# Patient Record
Sex: Female | Born: 1980
Health system: Southern US, Community
[De-identification: ages and names within clinical notes are randomized; demographics above are authoritative.]

## PROBLEM LIST (undated history)

## (undated) DIAGNOSIS — J45909 Unspecified asthma, uncomplicated: Secondary | ICD-10-CM

## (undated) DIAGNOSIS — K8000 Calculus of gallbladder with acute cholecystitis without obstruction: Secondary | ICD-10-CM

## (undated) DIAGNOSIS — F32A Depression, unspecified: Secondary | ICD-10-CM

## (undated) DIAGNOSIS — N200 Calculus of kidney: Secondary | ICD-10-CM

## (undated) DIAGNOSIS — R030 Elevated blood-pressure reading, without diagnosis of hypertension: Secondary | ICD-10-CM

## (undated) DIAGNOSIS — F329 Major depressive disorder, single episode, unspecified: Secondary | ICD-10-CM

## (undated) DIAGNOSIS — E669 Obesity, unspecified: Secondary | ICD-10-CM

## (undated) HISTORY — DX: Elevated blood-pressure reading, without diagnosis of hypertension: R03.0

## (undated) HISTORY — DX: Obesity, unspecified: E66.9

---
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
## 2002-07-29 DIAGNOSIS — N200 Calculus of kidney: Secondary | ICD-10-CM

## 2002-07-29 HISTORY — DX: Calculus of kidney: N20.0

## 2011-08-01 ENCOUNTER — Ambulatory Visit (INDEPENDENT_AMBULATORY_CARE_PROVIDER_SITE_OTHER): Payer: BC Managed Care – PPO | Admitting: Family Medicine

## 2011-08-01 ENCOUNTER — Encounter: Payer: Self-pay | Admitting: Family Medicine

## 2011-08-01 ENCOUNTER — Encounter: Payer: Self-pay | Admitting: *Deleted

## 2011-08-01 DIAGNOSIS — R030 Elevated blood-pressure reading, without diagnosis of hypertension: Secondary | ICD-10-CM

## 2011-08-01 DIAGNOSIS — E669 Obesity, unspecified: Secondary | ICD-10-CM

## 2011-08-01 DIAGNOSIS — IMO0001 Reserved for inherently not codable concepts without codable children: Secondary | ICD-10-CM | POA: Insufficient documentation

## 2011-08-01 DIAGNOSIS — Z0000 Encounter for general adult medical examination without abnormal findings: Secondary | ICD-10-CM

## 2011-08-01 LAB — BASIC METABOLIC PANEL
CO2: 25 mEq/L (ref 19–32)
Chloride: 108 mEq/L (ref 96–112)
Creatinine, Ser: 0.6 mg/dL (ref 0.4–1.2)
Potassium: 4 mEq/L (ref 3.5–5.1)

## 2011-08-01 LAB — HEPATIC FUNCTION PANEL
ALT: 16 U/L (ref 0–35)
AST: 19 U/L (ref 0–37)
Alkaline Phosphatase: 57 U/L (ref 39–117)
Bilirubin, Direct: 0 mg/dL (ref 0.0–0.3)
Total Protein: 7.8 g/dL (ref 6.0–8.3)

## 2011-08-01 LAB — CBC WITH DIFFERENTIAL/PLATELET
Basophils Absolute: 0.1 10*3/uL (ref 0.0–0.1)
Hemoglobin: 13 g/dL (ref 12.0–15.0)
Lymphocytes Relative: 32.5 % (ref 12.0–46.0)
Monocytes Relative: 5.6 % (ref 3.0–12.0)
Neutro Abs: 4.8 10*3/uL (ref 1.4–7.7)
RBC: 4.16 Mil/uL (ref 3.87–5.11)
RDW: 12.9 % (ref 11.5–14.6)

## 2011-08-01 LAB — LIPID PANEL
Cholesterol: 196 mg/dL (ref 0–200)
LDL Cholesterol: 111 mg/dL — ABNORMAL HIGH (ref 0–99)
Total CHOL/HDL Ratio: 3

## 2011-08-01 MED ORDER — DROSPIRENONE-ETHINYL ESTRADIOL 3-0.02 MG PO TABS: 1.0000 | ORAL_TABLET | Freq: Every day | ORAL | Status: DC

## 2011-08-01 MED ORDER — SERTRALINE HCL 100 MG PO TABS: 100.0000 mg | ORAL_TABLET | Freq: Every day | ORAL | Status: DC

## 2011-08-01 NOTE — Patient Instructions (Signed)
Follow up in 1 month to recheck weight loss progress and blood pressure We'll call you with your nutrition appt Goal is exercising for 30 minutes, 4-5x/week Check out the resources at the [facility] Call with any questions or concerns Welcome!  We're glad to have you!

## 2011-08-01 NOTE — Progress Notes (Signed)
  Subjective:    Patient ID: Joan Atkinson, female    DOB: Dec 13, 1980, 31 y.o.   MRN: [id number]  HPI New to establish.  Previously lived in [place].  UTD on pap (due Oct 2013)  Elevated BP- + family hx, 'everybody in my family has high blood pressure'.  No CP, SOB, HAs, visual changes, edema.  Obesity- has done weight watchers x2.  Last year weighed 187lbs.  Will effectively lose weight w/ weight watchers but will gain it all back when stops.  Admits to emotional eating, family cx ([place] is centered around food.   Review of Systems For ROS see HPI     Objective:   Physical Exam  Vitals reviewed. Constitutional: She is oriented to person, place, and time. She appears well-developed and well-nourished. No distress.  HENT:  Head: Normocephalic and atraumatic.  Eyes: Conjunctivae and EOM are normal. Pupils are equal, round, and reactive to light.  Neck: Normal range of motion. Neck supple. No thyromegaly present.  Cardiovascular: Normal rate, regular rhythm, normal heart sounds and intact distal pulses.   No murmur heard. Pulmonary/Chest: Effort normal and breath sounds normal. No respiratory distress.  Abdominal: Soft. She exhibits no distension. There is no tenderness.  Musculoskeletal: She exhibits no edema.  Lymphadenopathy:    She has no cervical adenopathy.  Neurological: She is alert and oriented to person, place, and time.  Skin: Skin is warm and dry.  Psychiatric: She has a normal mood and affect. Her behavior is normal.          Assessment & Plan:

## 2011-08-03 LAB — VITAMIN D 1,25 DIHYDROXY
Vitamin D 1, 25 (OH)2 Total: 141 pg/mL — ABNORMAL HIGH (ref 18–72)
Vitamin D2 1, 25 (OH)2: <8 pg/mL
Vitamin D3 1, 25 (OH)2: 141 pg/mL

## 2011-08-06 ENCOUNTER — Encounter: Payer: Self-pay | Admitting: *Deleted

## 2011-08-11 NOTE — Assessment & Plan Note (Signed)
Pt w/ strong family hx of HTN.  Reviewed lifestyle modifications but will follow closely and start meds prn.  Pt expressed understanding and is in agreement w/ plan.

## 2011-08-11 NOTE — Assessment & Plan Note (Signed)
Ongoing struggle for pt.  Plans on resuming weight watchers.  Admits emotional component of eating.  Open to idea of seeing nutritionist/therapist.  Applauded her desire for change.  Will follow closely.

## 2011-08-27 ENCOUNTER — Encounter: Payer: Self-pay | Admitting: *Deleted

## 2011-08-27 ENCOUNTER — Encounter: Payer: BC Managed Care – PPO | Attending: Family Medicine | Admitting: *Deleted

## 2011-08-27 DIAGNOSIS — E669 Obesity, unspecified: Secondary | ICD-10-CM | POA: Insufficient documentation

## 2011-08-27 DIAGNOSIS — R030 Elevated blood-pressure reading, without diagnosis of hypertension: Secondary | ICD-10-CM | POA: Insufficient documentation

## 2011-08-27 DIAGNOSIS — Z713 Dietary counseling and surveillance: Secondary | ICD-10-CM | POA: Insufficient documentation

## 2011-08-27 NOTE — Patient Instructions (Addendum)
Goals:  Follow the D.A.S.H. Diet (see handout). --> Choose more whole grains, lean protein, low-fat dairy, and fruits/non-starchy vegetables. --> Aim for <2000 mg daily - check food labels or www.calorieking.com for sodium amounts.  Eat 3 meals/day; Stay within carb ranges on yellow card.   Avoid adding sodium to foods; try your own seasoning blend (see handout).  Add protein rich foods to all meals and snacks.   Aim for 30-45 min of physical activity 3-4 days/week; gradually increase to most days.  Increase fiber to 25-30 g daily.

## 2011-08-27 NOTE — Progress Notes (Signed)
Medical Nutrition Therapy:  Appt start time: 0900   End time:  1000.  Initial Assessment:  Elevated Blood Pressure, Obesity.  Pt here for assessment of elevated BP and obesity. Has lost ~3 lbs since MD visit (08/01/11) and eager to get started. Reports losing wt on Weight Watchers program, but gained back when resuming old eating habits. Good dietary intake overall, though excessive CHO and added sodium noted.  Walks small dog for 20 min daily; no other structured exercise noted.   TANITA BODY COMPOSITION RESULTS: %Fat: 44.2 5 FM: 94.0 lbs FFM: 119.0 lbs TBW: 87.0 lbs  MEDICATIONS: See medication list; reconciled with pt.   DIETARY INTAKE:  Usual eating pattern includes 3 meals and 2 snacks per day.  24-hr recall:  B ( AM): Fruit smoothie w/ ground oatmeal (1/4 c), yogurt, 2 % milk, frozen fruit (magic bullet) = 8-12 oz  Snk ( AM): peanut butter (6 pk nabs)  L ( PM): Chicken soup and chicken on bun, jalapeno pretzel pcs, sauteed bell peppers w/ garlic; diet coke Snk ( PM): jalapeno pretzels D ( PM): Frozen salmon filet (sauteed) w/ garlic salt, sweet potato fries (3-4 oz) w/ olive oil & salt, steamed broccoli; water  Snk ( PM): Popcorn (homemade in canola oil on stove)  Usual physical activity:  Walks dog for 20 min/day - mild pace; sometimes more moderate.  Estimated energy needs: 1300-1500 calories 160-185 g carbohydrates 80-95 g protein 35-40 g fat  Progress Towards Goal(s):  In progress.   Nutritional Diagnosis:  Ashley-3.3 Obesity related to excessive CHO intake as evidenced by 24 hour food recall and a BMI of 37.4 kg/m^2.    Intervention/Goals:  Follow the D.A.S.H. Diet (see handout). --> Choose more whole grains, lean protein, low-fat dairy, and fruits/non-starchy vegetables. --> Aim for <2000 mg daily - check food labels or www.calorieking.com for sodium amounts.  Eat 3 meals/day; Stay within carb ranges on yellow card.   Avoid adding sodium to foods; try your own  seasoning blend (see handout).  Add protein rich foods to all meals and snacks.   Aim for 30-45 min of physical activity 3-4 days/week; gradually increase to most days.  Increase fiber to 25-30 g daily.  Handouts given during visit include:  High Blood Pressure/D.A.S.H. diet  Supermarket Guide  Snack List/45g CHO menu ideas  Monitoring/Evaluation:  Dietary intake, exercise, and body weight in 6 week(s).

## 2011-09-02 ENCOUNTER — Ambulatory Visit (INDEPENDENT_AMBULATORY_CARE_PROVIDER_SITE_OTHER): Payer: BC Managed Care – PPO | Admitting: Family Medicine

## 2011-09-02 ENCOUNTER — Encounter: Payer: Self-pay | Admitting: Family Medicine

## 2011-09-02 DIAGNOSIS — R030 Elevated blood-pressure reading, without diagnosis of hypertension: Secondary | ICD-10-CM

## 2011-09-02 DIAGNOSIS — E559 Vitamin D deficiency, unspecified: Secondary | ICD-10-CM | POA: Insufficient documentation

## 2011-09-02 DIAGNOSIS — E673 Hypervitaminosis D: Secondary | ICD-10-CM

## 2011-09-02 NOTE — Patient Instructions (Signed)
Follow up in 6 months- sooner if needed- to recheck weight and blood pressure Keep up the good work on healthy diet and regular exercise- you can do this! We'll notify you of your lab results Call with any questions or concerns Hang in there!!!

## 2011-09-02 NOTE — Assessment & Plan Note (Signed)
Pt's BP is closer to normal range today after changing her diet and losing 2 lbs.  Asymptomatic.  Will not start meds at this time but will continue to follow at future visits.  Pt expressed understanding and is in agreement w/ plan.

## 2011-09-02 NOTE — Assessment & Plan Note (Signed)
Identified at previous visit.  Repeat labs today and see if value improves.

## 2011-09-02 NOTE — Progress Notes (Signed)
  Subjective:    Patient ID: Joan Atkinson, female    DOB: 1980-08-15, 31 y.o.   MRN: [id number]  HPI Elevated BP- has lost 2 lbs, saw nutrition, 'she was super helpful'.  BP is better today.  No CP, SOB, HAs, visual changes, edema.  Elevated Vit D- admits eating salmon 2-3x/week, not taking supplement.   Review of Systems For ROS see HPI     Objective:   Physical Exam  Vitals reviewed. Constitutional: She is oriented to person, place, and time. She appears well-developed and well-nourished. No distress.  HENT:  Head: Normocephalic and atraumatic.  Eyes: Conjunctivae and EOM are normal. Pupils are equal, round, and reactive to light.  Neck: Normal range of motion. Neck supple. No thyromegaly present.  Cardiovascular: Normal rate, regular rhythm, normal heart sounds and intact distal pulses.   No murmur heard. Pulmonary/Chest: Effort normal and breath sounds normal. No respiratory distress.  Abdominal: Soft. She exhibits no distension. There is no tenderness.  Musculoskeletal: She exhibits no edema.  Lymphadenopathy:    She has no cervical adenopathy.  Neurological: She is alert and oriented to person, place, and time.  Skin: Skin is warm and dry.  Psychiatric: She has a normal mood and affect. Her behavior is normal.          Assessment & Plan:

## 2011-09-06 LAB — VITAMIN D 1,25 DIHYDROXY
Vitamin D 1, 25 (OH)2 Total: 98 pg/mL — ABNORMAL HIGH (ref 18–72)
Vitamin D2 1, 25 (OH)2: <8 pg/mL
Vitamin D3 1, 25 (OH)2: 98 pg/mL

## 2012-03-02 ENCOUNTER — Ambulatory Visit: Payer: BC Managed Care – PPO | Admitting: Family Medicine

## 2012-03-09 ENCOUNTER — Ambulatory Visit (INDEPENDENT_AMBULATORY_CARE_PROVIDER_SITE_OTHER): Payer: BC Managed Care – PPO | Admitting: Family Medicine

## 2012-03-09 ENCOUNTER — Encounter: Payer: Self-pay | Admitting: Family Medicine

## 2012-03-09 VITALS — BP 128/82 | HR 90 | Temp 98.5°F | Ht 64.0 in | Wt 215.2 lb

## 2012-03-09 DIAGNOSIS — R030 Elevated blood-pressure reading, without diagnosis of hypertension: Secondary | ICD-10-CM

## 2012-03-09 DIAGNOSIS — IMO0001 Reserved for inherently not codable concepts without codable children: Secondary | ICD-10-CM

## 2012-03-09 DIAGNOSIS — J309 Allergic rhinitis, unspecified: Secondary | ICD-10-CM

## 2012-03-09 MED ORDER — CETIRIZINE HCL 10 MG PO TABS: 10.0000 mg | ORAL_TABLET | Freq: Every day | ORAL | Status: DC

## 2012-03-09 NOTE — Assessment & Plan Note (Signed)
Improved.  Encouraged pt to continue healthy food choices and get regular exercise.  Will continue to follow.

## 2012-03-09 NOTE — Patient Instructions (Addendum)
Schedule your complete physical in 6 months Keep up the good work!  You look great! Start the Zyrtec daily Call with any questions or concerns Enjoy what's left of summer!!!

## 2012-03-09 NOTE — Assessment & Plan Note (Signed)
New.  Pt w/out evidence of infxn on PE.  Start daily OTC antihistamine.  Reviewed supportive care and red flags that should prompt return.  Pt expressed understanding and is in agreement w/ plan.

## 2012-03-09 NOTE — Progress Notes (Signed)
  Subjective:    Patient ID: Joan Atkinson, female    DOB: Dec 28, 1980, 31 y.o.   MRN: [id number]  HPI Elevated BP- much better today.  No CP, SOB, HAs, visual changes, edema.  Exercising 3-4x/week.  Has eliminated artificial sweeteners from diet.  Nasal congestion- recently flew home from [place], no fevers.  Mucous is clear.  No ear pain, facial pain.  mucinex helped w/ some tooth pain on Friday.  Mild cough- particularly in AM.  + PND.  Was asymptomatic in [place].  sxs started Thursday night.   Review of Systems For ROS see HPI     Objective:   Physical Exam  Vitals reviewed. Constitutional: She appears well-developed and well-nourished. No distress.  HENT:  Head: Normocephalic and atraumatic.  Right Ear: Tympanic membrane normal.  Left Ear: Tympanic membrane normal.  Nose: Mucosal edema and rhinorrhea present. Right sinus exhibits no maxillary sinus tenderness and no frontal sinus tenderness. Left sinus exhibits no maxillary sinus tenderness and no frontal sinus tenderness.  Mouth/Throat: Mucous membranes are normal. Posterior oropharyngeal erythema (w/ PND) present.  Eyes: Conjunctivae and EOM are normal. Pupils are equal, round, and reactive to light.  Neck: Normal range of motion. Neck supple. No thyromegaly present.  Cardiovascular: Normal rate, regular rhythm and normal heart sounds.   Pulmonary/Chest: Effort normal and breath sounds normal. No respiratory distress. She has no wheezes. She has no rales.  Musculoskeletal: She exhibits no edema.  Lymphadenopathy:    She has no cervical adenopathy.  Skin: Skin is warm and dry.  Psychiatric: She has a normal mood and affect. Her behavior is normal.          Assessment & Plan:

## 2012-05-30 ENCOUNTER — Encounter (HOSPITAL_BASED_OUTPATIENT_CLINIC_OR_DEPARTMENT_OTHER): Payer: Self-pay | Admitting: *Deleted

## 2012-05-30 ENCOUNTER — Emergency Department (HOSPITAL_BASED_OUTPATIENT_CLINIC_OR_DEPARTMENT_OTHER)
Admission: EM | Admit: 2012-05-30 | Discharge: 2012-05-30 | Disposition: A | Discharge status: Home / Self Care | Payer: BC Managed Care – PPO | Attending: Emergency Medicine | Admitting: Emergency Medicine

## 2012-05-30 DIAGNOSIS — W260XXA Contact with knife, initial encounter: Secondary | ICD-10-CM | POA: Insufficient documentation

## 2012-05-30 DIAGNOSIS — S61409A Unspecified open wound of unspecified hand, initial encounter: Secondary | ICD-10-CM | POA: Insufficient documentation

## 2012-05-30 DIAGNOSIS — Z79899 Other long term (current) drug therapy: Secondary | ICD-10-CM | POA: Insufficient documentation

## 2012-05-30 DIAGNOSIS — Y929 Unspecified place or not applicable: Secondary | ICD-10-CM | POA: Insufficient documentation

## 2012-05-30 DIAGNOSIS — S61419A Laceration without foreign body of unspecified hand, initial encounter: Secondary | ICD-10-CM

## 2012-05-30 DIAGNOSIS — R030 Elevated blood-pressure reading, without diagnosis of hypertension: Secondary | ICD-10-CM | POA: Insufficient documentation

## 2012-05-30 DIAGNOSIS — Z23 Encounter for immunization: Secondary | ICD-10-CM | POA: Insufficient documentation

## 2012-05-30 DIAGNOSIS — Z87891 Personal history of nicotine dependence: Secondary | ICD-10-CM | POA: Insufficient documentation

## 2012-05-30 DIAGNOSIS — J45909 Unspecified asthma, uncomplicated: Secondary | ICD-10-CM | POA: Insufficient documentation

## 2012-05-30 DIAGNOSIS — Y939 Activity, unspecified: Secondary | ICD-10-CM | POA: Insufficient documentation

## 2012-05-30 MED ORDER — LIDOCAINE HCL 2 % IJ SOLN
INTRAMUSCULAR | Status: AC
  Administered 2012-05-30: 19:00:00
  Filled 2012-05-30: qty 20

## 2012-05-30 MED ORDER — TETANUS-DIPHTH-ACELL PERTUSSIS 5-2.5-18.5 LF-MCG/0.5 IM SUSP
0.5000 mL | Freq: Once | INTRAMUSCULAR | Status: AC
  Administered 2012-05-30: 0.5 mL via INTRAMUSCULAR
  Filled 2012-05-30: qty 0.5

## 2012-05-30 NOTE — ED Notes (Signed)
Pt presents to ED today with lac to left hand from a kitchen knife.  Bleeding is controlled

## 2012-05-30 NOTE — ED Provider Notes (Signed)
History  This chart was scribed for Jones Skene, MD by Marlin Canary. The patient was seen in room MH02/MH02. Patient's care was started at 1817.  CSN: [id number]  Arrival date & time 05/30/12  1754   First MD Initiated Contact with Patient 05/30/12 1817      Chief Complaint  Patient presents with  . Extremity Laceration     The history is provided by the patient. No language interpreter was used.    Joan Atkinson is a 31 y.o. female who presents to the Emergency Department complaining mildly painful laceration to palmar surface of left hand onset 1.5 hours ago when she accidentally stabbed herself with a kitchen knife while cutting a sweet potato. She rates pain as 2 out of 10. Patient states that there was a moderate amount of bleeding. She says that she cleansed the laceration with water and then wrapped the area with a towel. Patient states that ROM and sensation in her hand is normal. She denies any other symptoms at this time. Patient is a former smoker. She consumes alcohol socially. Patient's Tdap is out of date. Patient's medical history includes asthma.    Past Medical History  Diagnosis Date  . Asthma   . Elevated blood pressure reading without diagnosis of hypertension   . Obesity, unspecified     Past Surgical History  Procedure Date  . Epideral hemotoma 1983    Family History  Problem Relation Age of Onset  . Hypertension Mother   . Hyperlipidemia Mother   . Hyperlipidemia Father   . Hypertension Sister   . Hyperlipidemia Sister   . Mental illness Sister   . Hypertension Maternal Aunt   . Hyperlipidemia Maternal Aunt   . Hypertension Paternal Aunt   . Hyperlipidemia Paternal Aunt   . Hypertension Maternal Grandmother   . Hyperlipidemia Maternal Grandfather   . Hypertension Maternal Grandfather   . Diabetes Maternal Grandfather   . Heart disease Maternal Grandfather   . Cancer Maternal Grandfather   . Hypertension Paternal Grandmother   .  Hyperlipidemia Paternal Grandmother   . Stroke Paternal Grandfather     History  Substance Use Topics  . Smoking status: Former Smoker    Types: Cigarettes  . Smokeless tobacco: Never Used   Comment: Quit smoking cigarettes 3-4 yrs ago - 0.25 ppd  . Alcohol Use: Yes     socially    OB History    Grav Para Term Preterm Abortions TAB SAB Ect Mult Living                  Review of Systems At least 10pt or greater review of systems completed and are negative except where specified in the HPI.  Allergies  Review of patient's allergies indicates no known allergies.  Home Medications   Current Outpatient Rx  Name Route Sig Dispense Refill  . CETIRIZINE HCL 10 MG PO TABS Oral Take 1 tablet (10 mg total) by mouth daily. 30 tablet 11  . DROSPIRENONE-ETHINYL ESTRADIOL 3-0.02 MG PO TABS Oral Take 1 tablet by mouth daily. 1 Package 11  . SERTRALINE HCL 100 MG PO TABS Oral Take 1 tablet (100 mg total) by mouth daily. 30 tablet 11    BP 155/97  Pulse 90  Temp 98.5 F (36.9 C)  Resp 20  SpO2 99%  Physical Exam  Nursing notes reviewed.  Electronic medical record reviewed. VITAL SIGNS:   Filed Vitals:   05/30/12 1810  BP: 155/97  Pulse: 90  Temp: 98.5  F (36.9 C)  Resp: 20  SpO2: 99%   CONSTITUTIONAL: Awake, oriented, appears non-toxic HENT: Atraumatic, normocephalic, oral mucosa pink and moist, airway patent. Nares patent without drainage. External ears normal. EYES: Conjunctiva clear, EOMI, PERRLA NECK: Trachea midline, non-tender, supple CARDIOVASCULAR: Normal heart rate, Normal rhythm, No murmurs, rubs, gallops PULMONARY/CHEST: Clear to auscultation, no rhonchi, wheezes, or rales. Symmetrical breath sounds. Non-tender. ABDOMINAL: Non-distended, soft, non-tender - no rebound or guarding.  BS normal. NEUROLOGIC: Non-focal, moving all four extremities, no gross sensory or motor deficits. EXTREMITIES: No clubbing, cyanosis, or edema. 1.5 simple, straight laceration to  left palm with minimal exposed underlying fatty tissue. No tendon involvement.  Fascia not visible.  No FB.  Distally NVI. SKIN: Warm, Dry, No erythema, No rash  ED Course  LACERATION REPAIR Performed by: Jones Skene Authorized by: Jones Skene Consent: Verbal consent obtained. Patient identity confirmed: verbally with patient Time out: Immediately prior to procedure a "time out" was called to verify the correct patient, procedure, equipment, support staff and site/side marked as required. Body area: upper extremity Location details: left hand Laceration length: 1.5 cm Foreign bodies: no foreign bodies Tendon involvement: none Nerve involvement: none Vascular damage: no Anesthesia: local infiltration Local anesthetic: lidocaine 2% with epinephrine Anesthetic total: 2 ml Patient sedated: no Preparation: Patient was prepped and draped in the usual sterile fashion. Irrigation solution: saline Amount of cleaning: standard Debridement: none Degree of undermining: none Skin closure: 4-0 Prolene Number of sutures: 3 Technique: simple Approximation: close Approximation difficulty: simple Dressing: 4x4 sterile gauze and antibiotic ointment Patient tolerance: Patient tolerated the procedure well with no immediate complications.   (including critical care time) COORDINATION OF CARE: 6:35pm- Patient informed of current plan for treatment and evaluation and agrees with plan at this time. Placed 3 interrupted sutures at laceration site.     Labs Reviewed - No data to display No results found.   1. Laceration of skin of palm       MDM  Joan Atkinson is a 31 y.o. female presented with laceration to left palm.  Repaired wound, updated tetanus.   I explained the diagnosis and have given explicit precautions to return to the ER including signs of infection or any other new or worsening symptoms. The patient understands and accepts the medical plan as it's been dictated and I  have answered their questions. Discharge instructions concerning home care and prescriptions have been given.  The patient is STABLE and is discharged to home in good condition.  I personally performed the services described in this documentation, which was scribed in my presence. The recorded information has been reviewed and considered. Jones Skene, M.D.      Jones Skene, MD 05/31/12 [phone]

## 2012-06-08 ENCOUNTER — Ambulatory Visit (INDEPENDENT_AMBULATORY_CARE_PROVIDER_SITE_OTHER): Payer: BC Managed Care – PPO | Admitting: Family Medicine

## 2012-06-08 VITALS — BP 120/82 | HR 81 | Temp 99.0°F | Wt 222.0 lb

## 2012-06-08 DIAGNOSIS — S61409A Unspecified open wound of unspecified hand, initial encounter: Secondary | ICD-10-CM

## 2012-06-08 DIAGNOSIS — S61419A Laceration without foreign body of unspecified hand, initial encounter: Secondary | ICD-10-CM

## 2012-06-08 NOTE — Assessment & Plan Note (Signed)
New.  Well healed.  Sutures removed w/out difficulty.  Reviewed after care instructions.  Pt expressed understanding and is in agreement w/ plan.

## 2012-06-08 NOTE — Progress Notes (Signed)
  Subjective:    Patient ID: Joan Atkinson, female    DOB: July 20, 1981, 31 y.o.   MRN: [id number]  HPI Suture removal- pt cut hand on knife while attempting to cut sweet potato 9 days ago.  Required 3 stitches to L palm.  Pt reports no pain, drainage.  Here for removal   Review of Systems For ROS see HPI     Objective:   Physical Exam  Vitals reviewed. Constitutional: She appears well-developed and well-nourished. No distress.  Skin: Skin is warm and dry.       Well healed palmar incision on L w/ 3 suture.  Easily removed sutures, area cleaned w/ H2O2, triple abx ointment applied and band aid in place.          Assessment & Plan:

## 2012-08-24 ENCOUNTER — Other Ambulatory Visit: Payer: Self-pay | Admitting: Family Medicine

## 2012-08-24 NOTE — Telephone Encounter (Signed)
Spoke with the pt to verify [facility] med.  Pt stated that she did not request refill for [facility].   Informed the pt that the pharmacy sent refill request for her [facility] and it not the same [facility] we have on her med list.  Pt stated that she does not get her [facility] from anyone else.  Pt stated that she has an appt for CPE with Dr. Beverely Low next mos(08-31-12 @ 8:30am).  She will request RF's on her [facility] then.  Pt stated that she will not be running out before the appt.  Will disregard the RF request.//AB/CMA

## 2012-08-24 NOTE — Telephone Encounter (Signed)
REFILL LORYNA 2MG -0.02MG  TABLET #280 TAKE 1 TABLET BY MOUTH DAILY LAST FILL 12.31.13

## 2012-08-24 NOTE — Telephone Encounter (Signed)
LM @ (2:57pm) asking the pt to RTC.//AB/CMA

## 2012-08-27 ENCOUNTER — Telehealth: Payer: Self-pay | Admitting: Family Medicine

## 2012-08-27 NOTE — Telephone Encounter (Signed)
Please advise on RF request.  Last OV:08-01-11.//AB/CMA

## 2012-08-27 NOTE — Telephone Encounter (Signed)
Refill: Sertraline hcl 100mg  tablet. Take 1 tablet by mouth daily. Qty 30. Last fill 07-30-12

## 2012-08-27 NOTE — Telephone Encounter (Signed)
Ok for #30, 6 refills b/c pt has upcoming CPE scheduled

## 2012-08-28 MED ORDER — SERTRALINE HCL 100 MG PO TABS: 100.0000 mg | ORAL_TABLET | Freq: Every day | ORAL | Status: DC

## 2012-08-28 NOTE — Telephone Encounter (Signed)
RX sent

## 2012-08-31 ENCOUNTER — Other Ambulatory Visit (HOSPITAL_COMMUNITY)
Admission: RE | Admit: 2012-08-31 | Discharge: 2012-08-31 | Disposition: A | Discharge status: Home / Self Care | Payer: BC Managed Care – PPO | Source: Ambulatory Visit | Attending: Family Medicine | Admitting: Family Medicine

## 2012-08-31 ENCOUNTER — Ambulatory Visit (INDEPENDENT_AMBULATORY_CARE_PROVIDER_SITE_OTHER): Payer: BC Managed Care – PPO | Admitting: Family Medicine

## 2012-08-31 ENCOUNTER — Encounter: Payer: Self-pay | Admitting: Family Medicine

## 2012-08-31 VITALS — BP 130/78 | HR 76 | Temp 98.4°F | Ht 63.75 in | Wt 222.0 lb

## 2012-08-31 DIAGNOSIS — Z124 Encounter for screening for malignant neoplasm of cervix: Secondary | ICD-10-CM

## 2012-08-31 DIAGNOSIS — Z0000 Encounter for general adult medical examination without abnormal findings: Secondary | ICD-10-CM | POA: Insufficient documentation

## 2012-08-31 DIAGNOSIS — Z01419 Encounter for gynecological examination (general) (routine) without abnormal findings: Secondary | ICD-10-CM

## 2012-08-31 LAB — LDL CHOLESTEROL, DIRECT: Direct LDL: 135.6 mg/dL

## 2012-08-31 LAB — CBC WITH DIFFERENTIAL/PLATELET
Basophils Relative: 0.8 % (ref 0.0–3.0)
Eosinophils Relative: 1.9 % (ref 0.0–5.0)
Lymphocytes Relative: 34.4 % (ref 12.0–46.0)
MCV: 87.6 fl (ref 78.0–100.0)
Monocytes Absolute: 0.5 10*3/uL (ref 0.1–1.0)
Monocytes Relative: 5.6 % (ref 3.0–12.0)
Neutrophils Relative %: 57.3 % (ref 43.0–77.0)
Platelets: 318 10*3/uL (ref 150.0–400.0)
RBC: 4.28 Mil/uL (ref 3.87–5.11)
WBC: 8.7 10*3/uL (ref 4.5–10.5)

## 2012-08-31 LAB — BASIC METABOLIC PANEL
Chloride: 105 mEq/L (ref 96–112)
GFR: 135.42 mL/min (ref >60.00)
Glucose, Bld: 84 mg/dL (ref 70–99)
Potassium: 4 mEq/L (ref 3.5–5.1)
Sodium: 138 mEq/L (ref 135–145)

## 2012-08-31 LAB — HEPATIC FUNCTION PANEL
ALT: 23 U/L (ref 0–35)
AST: 24 U/L (ref 0–37)
Albumin: 3.8 g/dL (ref 3.5–5.2)
Alkaline Phosphatase: 56 U/L (ref 39–117)

## 2012-08-31 LAB — LIPID PANEL: Cholesterol: 211 mg/dL — ABNORMAL HIGH (ref 0–200)

## 2012-08-31 MED ORDER — DROSPIRENONE-ETHINYL ESTRADIOL 3-0.02 MG PO TABS: 1.0000 | ORAL_TABLET | Freq: Every day | ORAL | Status: DC

## 2012-08-31 MED ORDER — SERTRALINE HCL 100 MG PO TABS: 100.0000 mg | ORAL_TABLET | Freq: Every day | ORAL | Status: DC

## 2012-08-31 MED ORDER — CETIRIZINE HCL 10 MG PO TABS: 10.0000 mg | ORAL_TABLET | Freq: Every day | ORAL | Status: DC

## 2012-08-31 NOTE — Assessment & Plan Note (Signed)
Pt's PE WNL.  Check labs.  Anticipatory guidance provided.  

## 2012-08-31 NOTE — Addendum Note (Signed)
Addended by: Sheliah Hatch on: 08/31/2012 09:12 AM   Modules accepted: Orders

## 2012-08-31 NOTE — Patient Instructions (Addendum)
Follow up in 1 year or as needed Keep up the good work!  You look good! We'll notify you of your lab results and make any changes if needed Call with any questions or concerns Happy Monday!!!

## 2012-08-31 NOTE — Progress Notes (Signed)
  Subjective:    Patient ID: Joan Atkinson, female    DOB: 05/03/1981, 32 y.o.   MRN: [id number]  HPI CPE- no concerns today.   Review of Systems Patient reports no vision/ hearing changes, adenopathy,fever, weight change,  persistant/recurrent hoarseness , swallowing issues, chest pain, palpitations, edema, persistant/recurrent cough, hemoptysis, dyspnea (rest/exertional/paroxysmal nocturnal), gastrointestinal bleeding (melena, rectal bleeding), abdominal pain, significant heartburn, bowel changes, GU symptoms (dysuria, hematuria, incontinence), Gyn symptoms (abnormal  bleeding, pain),  syncope, focal weakness, memory loss, numbness & tingling, skin/hair/nail changes, abnormal bruising or bleeding, anxiety, or depression.     Objective:   Physical Exam  General Appearance:    Alert, cooperative, no distress, appears stated age  Head:    Normocephalic, without obvious abnormality, atraumatic  Eyes:    PERRL, conjunctiva/corneas clear, EOM's intact, fundi    benign, both eyes  Ears:    Normal TM's and external ear canals, both ears  Nose:   Nares normal, septum midline, mucosa normal, no drainage    or sinus tenderness  Throat:   Lips, mucosa, and tongue normal; teeth and gums normal  Neck:   Supple, symmetrical, trachea midline, no adenopathy;    Thyroid: no enlargement/tenderness/nodules  Back:     Symmetric, no curvature, ROM normal, no CVA tenderness  Lungs:     Clear to auscultation bilaterally, respirations unlabored  Chest Wall:    No tenderness or deformity   Heart:    Regular rate and rhythm, S1 and S2 normal, no murmur, rub   or gallop  Breast Exam:    No tenderness, masses, or nipple abnormality  Abdomen:     Soft, non-tender, bowel sounds active all four quadrants,    no masses, no organomegaly  Genitalia:    External genitalia normal, cervix normal in appearance, no CMT, uterus in normal size and position, adnexa w/out mass or tenderness, mucosa pink and moist, no lesions  or discharge present  Rectal:    Normal external appearance  Extremities:   Extremities normal, atraumatic, no cyanosis or edema  Pulses:   2+ and symmetric all extremities  Skin:   Skin color, texture, turgor normal, no rashes or lesions  Lymph nodes:   Cervical, supraclavicular, and axillary nodes normal  Neurologic:   CNII-XII intact, normal strength, sensation and reflexes    throughout          Assessment & Plan:

## 2012-08-31 NOTE — Assessment & Plan Note (Signed)
Pap collected. 

## 2012-09-01 ENCOUNTER — Encounter: Payer: Self-pay | Admitting: *Deleted

## 2012-09-03 LAB — VITAMIN D 1,25 DIHYDROXY
Vitamin D2 1, 25 (OH)2: <8 pg/mL
Vitamin D3 1, 25 (OH)2: 81 pg/mL

## 2012-09-04 ENCOUNTER — Encounter: Payer: Self-pay | Admitting: *Deleted

## 2013-03-11 ENCOUNTER — Other Ambulatory Visit: Payer: Self-pay | Admitting: Family Medicine

## 2013-03-11 NOTE — Telephone Encounter (Signed)
Rx sent to the pharmacy by e-script.//AB/CMA 

## 2013-03-15 ENCOUNTER — Telehealth: Payer: Self-pay

## 2013-03-15 ENCOUNTER — Encounter: Payer: Self-pay | Admitting: General Practice

## 2013-03-15 ENCOUNTER — Observation Stay (HOSPITAL_COMMUNITY)
Admission: EM | Admit: 2013-03-15 | Discharge: 2013-03-17 | Disposition: A | Discharge status: Home / Self Care | Payer: BC Managed Care – PPO | Attending: Surgery | Admitting: Surgery

## 2013-03-15 ENCOUNTER — Ambulatory Visit (HOSPITAL_BASED_OUTPATIENT_CLINIC_OR_DEPARTMENT_OTHER)
Admission: RE | Admit: 2013-03-15 | Discharge: 2013-03-15 | Disposition: A | Discharge status: Home / Self Care | Payer: BC Managed Care – PPO | Source: Ambulatory Visit | Attending: Family Medicine | Admitting: Family Medicine

## 2013-03-15 ENCOUNTER — Ambulatory Visit (INDEPENDENT_AMBULATORY_CARE_PROVIDER_SITE_OTHER): Payer: BC Managed Care – PPO | Admitting: Family Medicine

## 2013-03-15 ENCOUNTER — Encounter (HOSPITAL_COMMUNITY): Payer: Self-pay | Admitting: Nurse Practitioner

## 2013-03-15 ENCOUNTER — Encounter: Payer: Self-pay | Admitting: Family Medicine

## 2013-03-15 VITALS — BP 134/86 | HR 60 | Temp 98.5°F | Ht 63.75 in | Wt 227.4 lb

## 2013-03-15 DIAGNOSIS — E669 Obesity, unspecified: Secondary | ICD-10-CM | POA: Insufficient documentation

## 2013-03-15 DIAGNOSIS — Z6839 Body mass index (BMI) 39.0-39.9, adult: Secondary | ICD-10-CM | POA: Insufficient documentation

## 2013-03-15 DIAGNOSIS — K8000 Calculus of gallbladder with acute cholecystitis without obstruction: Secondary | ICD-10-CM

## 2013-03-15 DIAGNOSIS — J45909 Unspecified asthma, uncomplicated: Secondary | ICD-10-CM | POA: Insufficient documentation

## 2013-03-15 DIAGNOSIS — E860 Dehydration: Secondary | ICD-10-CM | POA: Insufficient documentation

## 2013-03-15 DIAGNOSIS — R1013 Epigastric pain: Secondary | ICD-10-CM

## 2013-03-15 DIAGNOSIS — R1011 Right upper quadrant pain: Secondary | ICD-10-CM | POA: Insufficient documentation

## 2013-03-15 DIAGNOSIS — R030 Elevated blood-pressure reading, without diagnosis of hypertension: Secondary | ICD-10-CM | POA: Insufficient documentation

## 2013-03-15 DIAGNOSIS — K810 Acute cholecystitis: Secondary | ICD-10-CM

## 2013-03-15 HISTORY — DX: Depression, unspecified: F32.A

## 2013-03-15 HISTORY — DX: Major depressive disorder, single episode, unspecified: F32.9

## 2013-03-15 HISTORY — DX: Calculus of gallbladder with acute cholecystitis without obstruction: K80.00

## 2013-03-15 HISTORY — DX: Unspecified asthma, uncomplicated: J45.909

## 2013-03-15 HISTORY — DX: Calculus of kidney: N20.0

## 2013-03-15 LAB — CBC WITH DIFFERENTIAL/PLATELET
Basophils Relative: 0.3 % (ref 0.0–3.0)
Eosinophils Absolute: 0 10*3/uL (ref 0.0–0.7)
Eosinophils Absolute: 0 10*3/uL (ref 0.0–0.7)
Eosinophils Relative: 0.1 % (ref 0.0–5.0)
Eosinophils Relative: 0 % (ref 0–5)
HCT: 38.8 % (ref 36.0–46.0)
Hemoglobin: 13.6 g/dL (ref 12.0–15.0)
Lymphocytes Relative: 12.4 % (ref 12.0–46.0)
Lymphocytes Relative: 16 % (ref 12–46)
Lymphs Abs: 2.9 10*3/uL (ref 0.7–4.0)
MCH: 31 pg (ref 26.0–34.0)
MCV: 88.4 fL (ref 78.0–100.0)
Monocytes Absolute: 0.9 10*3/uL (ref 0.1–1.0)
Monocytes Relative: 2.4 % — ABNORMAL LOW (ref 3.0–12.0)
Monocytes Relative: 5 % (ref 3–12)
Neutrophils Relative %: 84.8 % — ABNORMAL HIGH (ref 43.0–77.0)
RBC: 4.35 Mil/uL (ref 3.87–5.11)
RBC: 4.39 MIL/uL (ref 3.87–5.11)
WBC: 14 10*3/uL — ABNORMAL HIGH (ref 4.5–10.5)
WBC: 17.6 10*3/uL — ABNORMAL HIGH (ref 4.0–10.5)

## 2013-03-15 LAB — URINALYSIS, ROUTINE W REFLEX MICROSCOPIC
Bilirubin Urine: NEGATIVE
Ketones, ur: NEGATIVE mg/dL
Leukocytes, UA: NEGATIVE
Nitrite: NEGATIVE
Specific Gravity, Urine: 1.023 (ref 1.005–1.030)
Urobilinogen, UA: 0.2 mg/dL (ref 0.0–1.0)
pH: 6.5 (ref 5.0–8.0)

## 2013-03-15 LAB — HEPATIC FUNCTION PANEL
ALT: 47 U/L — ABNORMAL HIGH (ref 0–35)
AST: 47 U/L — ABNORMAL HIGH (ref 0–37)
Total Bilirubin: 0.4 mg/dL (ref 0.3–1.2)
Total Protein: 8 g/dL (ref 6.0–8.3)

## 2013-03-15 LAB — COMPREHENSIVE METABOLIC PANEL
ALT: 43 U/L — ABNORMAL HIGH (ref 0–35)
BUN: 9 mg/dL (ref 6–23)
CO2: 23 mEq/L (ref 19–32)
Calcium: 9.6 mg/dL (ref 8.4–10.5)
GFR calc Af Amer: >90 mL/min (ref >90)
GFR calc non Af Amer: >90 mL/min (ref >90)
Glucose, Bld: 109 mg/dL — ABNORMAL HIGH (ref 70–99)
Total Protein: 8.1 g/dL (ref 6.0–8.3)

## 2013-03-15 LAB — BASIC METABOLIC PANEL
BUN: 9 mg/dL (ref 6–23)
CO2: 25 mEq/L (ref 19–32)
Chloride: 102 mEq/L (ref 96–112)
Glucose, Bld: 110 mg/dL — ABNORMAL HIGH (ref 70–99)
Potassium: 3.8 mEq/L (ref 3.5–5.1)

## 2013-03-15 LAB — H. PYLORI ANTIBODY, IGG: H Pylori IgG: NEGATIVE

## 2013-03-15 LAB — LIPASE, BLOOD: Lipase: 21 U/L (ref 11–59)

## 2013-03-15 LAB — LACTIC ACID, PLASMA: Lactic Acid, Venous: 4.1 mmol/L — ABNORMAL HIGH (ref 0.5–2.2)

## 2013-03-15 LAB — AMYLASE: Amylase: 78 U/L (ref 27–131)

## 2013-03-15 MED ORDER — SODIUM CHLORIDE 0.9 % IV BOLUS (SEPSIS)
1000.0000 mL | Freq: Once | INTRAVENOUS | Status: AC
  Administered 2013-03-15: 1000 mL via INTRAVENOUS

## 2013-03-15 MED ORDER — PIPERACILLIN-TAZOBACTAM 3.375 G IVPB 30 MIN
3.3750 g | Freq: Once | INTRAVENOUS | Status: AC
  Administered 2013-03-15: 3.375 g via INTRAVENOUS
  Filled 2013-03-15: qty 50

## 2013-03-15 MED ORDER — SODIUM CHLORIDE 0.9 % IV SOLN: INTRAVENOUS | Status: DC

## 2013-03-15 MED ORDER — DIPHENHYDRAMINE HCL 50 MG/ML IJ SOLN
12.5000 mg | Freq: Four times a day (QID) | INTRAMUSCULAR | Status: DC | PRN
Start: 2013-03-15 — End: 2013-03-17

## 2013-03-15 MED ORDER — PIPERACILLIN-TAZOBACTAM 3.375 G IVPB
3.3750 g | Freq: Three times a day (TID) | INTRAVENOUS | Status: DC
  Administered 2013-03-15 – 2013-03-16 (×2): 3.375 g via INTRAVENOUS
  Filled 2013-03-15 (×4): qty 50

## 2013-03-15 MED ORDER — KCL IN DEXTROSE-NACL 20-5-0.45 MEQ/L-%-% IV SOLN
INTRAVENOUS | Status: DC
  Administered 2013-03-15 – 2013-03-16 (×2): via INTRAVENOUS
  Administered 2013-03-16: 125 mL via INTRAVENOUS
  Filled 2013-03-15 (×7): qty 1000

## 2013-03-15 MED ORDER — HYDROCODONE-ACETAMINOPHEN 5-325 MG PO TABS: 1.0000 | ORAL_TABLET | Freq: Four times a day (QID) | ORAL | Status: DC | PRN

## 2013-03-15 MED ORDER — HEPARIN SODIUM (PORCINE) 5000 UNIT/ML IJ SOLN
5000.0000 [IU] | Freq: Once | INTRAMUSCULAR | Status: AC
  Administered 2013-03-15: 5000 [IU] via SUBCUTANEOUS
  Filled 2013-03-15: qty 1

## 2013-03-15 MED ORDER — ONDANSETRON HCL 4 MG/2ML IJ SOLN
4.0000 mg | Freq: Once | INTRAMUSCULAR | Status: AC
  Administered 2013-03-15: 4 mg via INTRAVENOUS
  Filled 2013-03-15: qty 2

## 2013-03-15 MED ORDER — DIPHENHYDRAMINE HCL 12.5 MG/5ML PO ELIX: 12.5000 mg | ORAL_SOLUTION | Freq: Four times a day (QID) | ORAL | Status: DC | PRN

## 2013-03-15 MED ORDER — ONDANSETRON 8 MG PO TBDP: 8.0000 mg | ORAL_TABLET | Freq: Three times a day (TID) | ORAL | Status: DC | PRN

## 2013-03-15 MED ORDER — MORPHINE SULFATE 2 MG/ML IJ SOLN
2.0000 mg | Freq: Once | INTRAMUSCULAR | Status: AC
  Administered 2013-03-15: 2 mg via INTRAVENOUS
  Filled 2013-03-15 (×2): qty 1

## 2013-03-15 MED ORDER — MORPHINE SULFATE 2 MG/ML IJ SOLN
1.0000 mg | INTRAMUSCULAR | Status: DC | PRN
  Administered 2013-03-15 – 2013-03-16 (×5): 2 mg via INTRAVENOUS
  Administered 2013-03-16: 4 mg via INTRAVENOUS
  Filled 2013-03-15 (×3): qty 1
  Filled 2013-03-15: qty 2
  Filled 2013-03-15 (×2): qty 1

## 2013-03-15 MED ORDER — ONDANSETRON HCL 4 MG/2ML IJ SOLN
4.0000 mg | Freq: Four times a day (QID) | INTRAMUSCULAR | Status: DC | PRN
  Filled 2013-03-15: qty 2

## 2013-03-15 MED ORDER — PANTOPRAZOLE SODIUM 40 MG IV SOLR
40.0000 mg | Freq: Every day | INTRAVENOUS | Status: DC
  Administered 2013-03-15: 40 mg via INTRAVENOUS
  Filled 2013-03-15 (×2): qty 40

## 2013-03-15 NOTE — Telephone Encounter (Signed)
Spoke with patient and advised of acute cholecystis. Per Dr. Rennis Golden recommendations, proceed to either [facility] or [facility] ED. Patient states understanding and will go to the hospital.

## 2013-03-15 NOTE — ED Provider Notes (Signed)
CSN: [id number]     Arrival date & time 03/15/13  1615 History     First MD Initiated Contact with Patient 03/15/13 1633     Chief Complaint  Patient presents with  . Abdominal Pain   (Consider location/radiation/quality/duration/timing/severity/associated sxs/prior Treatment) The history is provided by the patient. No language interpreter was used.  Joan Atkinson is a 32 y/o F with PMHx of asthma, elevated BP without diagnosis of HTN, obesity presenting to the ED wih abrupt onset of RUQ abdominal pain that started at approximately 2:00AM this morning. Patient reported that she thought that she as having heart burn - reported that she went to [employer] and got pepto-bismol and Tums without pain relief. Patient reported that the pain has been constant. Reported that the pain is a constant burning sensation to the RUQ and epigastric region without radiation. Reported that she has been feeling nauseous and reported at least 3 episodes of emesis that occurred today, mainly of fluid, denied NB and NB. Patient reported that she went to her PCP this morning and had an US performed that identified gallstones and cholecystitis - PCP recommended patient to come to the ED to be admitted. Denied fever, diarrhea, chest pain, shortness of breath, difficulty breathing, numbness, tingling, urinary complaints, neck pain, back pain.  PCP Dr. Beverely Low    Past Medical History  Diagnosis Date  . Asthma   . Elevated blood pressure reading without diagnosis of hypertension   . Obesity, unspecified    Past Surgical History  Procedure Laterality Date  . Epideral hemotoma  1983   Family History  Problem Relation Age of Onset  . Hypertension Mother   . Hyperlipidemia Mother   . Hyperlipidemia Father   . Hypertension Sister   . Hyperlipidemia Sister   . Mental illness Sister   . Hypertension Maternal Aunt   . Hyperlipidemia Maternal Aunt   . Hypertension Paternal Aunt   . Hyperlipidemia Paternal Aunt     . Hypertension Maternal Grandmother   . Heart disease Maternal Grandmother   . Hyperlipidemia Maternal Grandfather   . Hypertension Maternal Grandfather   . Diabetes Maternal Grandfather   . Heart disease Maternal Grandfather   . Cancer Maternal Grandfather   . Hypertension Paternal Grandmother   . Hyperlipidemia Paternal Grandmother   . Stroke Paternal Grandfather    History  Substance Use Topics  . Smoking status: Former Smoker    Types: Cigarettes  . Smokeless tobacco: Never Used     Comment: Quit smoking cigarettes 3-4 yrs ago - 0.25 ppd  . Alcohol Use: Yes     Comment: socially   OB History   Grav Para Term Preterm Abortions TAB SAB Ect Mult Living                 Review of Systems  Constitutional: Negative for fever.  HENT: Negative for trouble swallowing, neck pain and neck stiffness.   Eyes: Negative for visual disturbance.  Respiratory: Negative for cough, chest tightness and shortness of breath.   Cardiovascular: Negative for chest pain.  Gastrointestinal: Positive for nausea, vomiting and abdominal pain (RUQ). Negative for diarrhea.  Genitourinary: Negative for dysuria, hematuria and difficulty urinating.  Musculoskeletal: Negative for back pain and arthralgias.  Neurological: Negative for dizziness, weakness and headaches.  All other systems reviewed and are negative.    Allergies  Review of patient's allergies indicates no known allergies.  Home Medications   Current Outpatient Rx  Name  Route  Sig  Dispense  Refill  . Calcium Carbonate Antacid (TUMS PO)   Oral   Take 2 tablets by mouth as needed (heart burn).         . cetirizine (ZYRTEC) 10 MG tablet   Oral   Take 1 tablet (10 mg total) by mouth daily.   30 tablet   11   . drospirenone-ethinyl estradiol (GIANVI) 3-0.02 MG tablet   Oral   Take 1 tablet by mouth daily.   1 Package   11   . HYDROcodone-acetaminophen (NORCO/VICODIN) 5-325 MG per tablet   Oral   Take 1 tablet by mouth  every 6 (six) hours as needed for pain.   30 tablet   0   . ondansetron (ZOFRAN ODT) 8 MG disintegrating tablet   Oral   Take 1 tablet (8 mg total) by mouth every 8 (eight) hours as needed for nausea.   20 tablet   0   . sertraline (ZOLOFT) 100 MG tablet   Oral   Take 1 tablet (100 mg total) by mouth daily.   30 tablet   11    BP 138/77  Pulse 71  Temp(Src) 98.3 F (36.8 C) (Oral)  Resp 16  Ht 5\' 3"  (1.6 m)  Wt 225 lb (102.059 kg)  BMI 39.87 kg/m2  SpO2 99% Physical Exam  Nursing note and vitals reviewed. Constitutional: She is oriented to person, place, and time. She appears well-developed and well-nourished. No distress.  HENT:  Head: Normocephalic and atraumatic.  Eyes: Conjunctivae and EOM are normal. Pupils are equal, round, and reactive to light. Right eye exhibits no discharge. Left eye exhibits no discharge.  Neck: Normal range of motion. Neck supple.  Negative neck stiffness Negative nuchal rigidity Negative lymphadenopathy Negative pain upon palpation to the cervical spine  Cardiovascular: Normal rate, regular rhythm and normal heart sounds.  Exam reveals no friction rub.   No murmur heard. Pulses:      Radial pulses are 2+ on the right side, and 2+ on the left side.       Dorsalis pedis pulses are 2+ on the right side, and 2+ on the left side.  Pulmonary/Chest: Effort normal and breath sounds normal. No respiratory distress. She has no wheezes. She has no rales.  Abdominal: Soft. Bowel sounds are normal. She exhibits no distension. There is tenderness in the right upper quadrant. There is positive Murphy's sign. There is no rebound, no guarding and no tenderness at McBurney's point.    Musculoskeletal: Normal range of motion.  Lymphadenopathy:    She has no cervical adenopathy.  Neurological: She is alert and oriented to person, place, and time. No cranial nerve deficit. She exhibits normal muscle tone. Coordination normal.  Cranial nerves III-XII grossly  intact  Strength 5+/5+ with resistance Alert and oriented Follows commands Responds to questions appropriately  Skin: Skin is warm and dry. No rash noted. She is not diaphoretic. No erythema.  Psychiatric: She has a normal mood and affect. Her behavior is normal. Thought content normal.    ED Course   Procedures (including critical care time)  6:06PM Dr. Bebe Shaggy spoke with Surgery, Dr. Gaynelle Adu - patient to be admitted to Surgery services. Patient to be kept NPO.   Labs Reviewed  CBC WITH DIFFERENTIAL - Abnormal; Notable for the following:    WBC 17.6 (*)    Neutrophils Relative % 78 (*)    Neutro Abs 13.8 (*)    All other components within normal limits  COMPREHENSIVE METABOLIC PANEL - Abnormal;  Notable for the following:    Glucose, Bld 109 (*)    AST 38 (*)    ALT 43 (*)    Total Bilirubin 0.2 (*)    All other components within normal limits  LACTIC ACID, PLASMA - Abnormal; Notable for the following:    Lactic Acid, Venous 4.1 (*)    All other components within normal limits  URINALYSIS, ROUTINE W REFLEX MICROSCOPIC  PREGNANCY, URINE  LIPASE, BLOOD   US Abdomen Complete  03/15/2013   *RADIOLOGY REPORT*  Clinical Data:  32 year old female with severe abdominal pain, nausea and vomiting.  ABDOMINAL ULTRASOUND COMPLETE  Comparison:  None  Findings:  Gallbladder: Multiple mobile gallstones are identified.  A 6 mm gallstone in the gallbladder neck does not appear to move.  There is no evidence of gallbladder wall thickening.  An equivocal sonographic Murphy's sign is noted.  Common Bile Duct:  There is no evidence of intrahepatic or extrahepatic biliary dilation. The CBD measures 3.0 mm in greatest diameter.  Liver: Diffusely increased echogenicity of the liver is compatible with fatty infiltration.  No focal abnormalities are identified.  IVC:  Appears normal.  Pancreas:  Although the pancreas is difficult to visualize in its entirety, no focal pancreatic abnormality is  identified.  Spleen:  Within normal limits in size and echotexture.  Right kidney:  The right kidney is normal in size and parenchymal echogenicity.  There is no evidence of solid mass, hydronephrosis or definite renal calculi.  The right kidney measures 11.7 cm.  Left kidney:  The left kidney is normal in size and parenchymal echogenicity.  There is no evidence of solid mass, hydronephrosis or definite renal calculi.   The left kidney measures 11.7 cm.  Abdominal Aorta:  No abdominal aortic aneurysm identified.  There is no evidence of ascites.  IMPRESSION: Cholelithiasis with equivocal findings for acute cholecystitis.  6 mm gallstone appears lodged in the gallbladder neck.  No evidence of biliary dilatation.  Fatty infiltration of the liver.   Original Report Authenticated By: Harmon Pier, M.D.   1. Acute cholecystitis     MDM  Patient presenting to the ED with RUQ pain with acute onset this morning at approximately 2:00AM. Alert and oriented. Positive Murphy's sign. Pain upon palpation to the RUQ and epigastric region. Negative peritoneal and acute abdomen signs. BS normoactive in all 4 quadrants.  UA negative for infection. Negative urine pregnancy. CBC elevated WBC noted (17.6). Elevated lactic acid of 4.1. Negative elevation of lipase. CMP mild elevation of AST (38) and ALT (43). [place] of RUQ noted gallstones with 6 mm gallstone lodged in the neck of the gallbladder - scan equivocal of acute cholecystitis. Dr. Bebe Shaggy spoke with Dr. Gaynelle Adu, surgery - patient to be admitted. Patient stable, afebrile. Patient admitted to surgery for acute cholecystitis.   Raymon Mutton, PA-C 03/16/13 [phone]

## 2013-03-15 NOTE — Assessment & Plan Note (Signed)
New.  Pt clearly uncomfortable- diaphoretic.  Check stat labs.  Get [place] to assess.  Pain meds and zofran prn.  Reviewed supportive care and red flags that should prompt return.  Pt expressed understanding and is in agreement w/ plan.

## 2013-03-15 NOTE — Patient Instructions (Signed)
We'll notify you of your lab results and your [place] Drink plenty of fluids- limit your foods to clear liquid diet Vicodin as needed for pain Zofran as needed for nausea REST! Hang in there!

## 2013-03-15 NOTE — H&P (Signed)
Joan Atkinson is an 32 y.o. female.   Chief Complaint: abdominal pain HPI:  32 yo obese female otherwise in good health awoke at 0200 with acute epigastric pain and RUQ pain. Pain is constant and sharp. No prior symptoms. Subsequently developed nausea and vomiting. Some chills no fever. Went to [employer] and tried [employer] and pepto without relief. Saw PCP who ordered u/s and tests. Elevated WBC and gallstones on u/s. Was directed to ED.  No melena, hematochezia, NSAIDs, jaundice, D/C, wt loss.   Past Medical History  Diagnosis Date  . Asthma   . Elevated blood pressure reading without diagnosis of hypertension   . Obesity, unspecified     Past Surgical History  Procedure Laterality Date  . Epideral hemotoma  1983    Family History  Problem Relation Age of Onset  . Hypertension Mother   . Hyperlipidemia Mother   . Hyperlipidemia Father   . Hypertension Sister   . Hyperlipidemia Sister   . Mental illness Sister   . Hypertension Maternal Aunt   . Hyperlipidemia Maternal Aunt   . Hypertension Paternal Aunt   . Hyperlipidemia Paternal Aunt   . Hypertension Maternal Grandmother   . Heart disease Maternal Grandmother   . Hyperlipidemia Maternal Grandfather   . Hypertension Maternal Grandfather   . Diabetes Maternal Grandfather   . Heart disease Maternal Grandfather   . Cancer Maternal Grandfather   . Hypertension Paternal Grandmother   . Hyperlipidemia Paternal Grandmother   . Stroke Paternal Grandfather    Social History:  reports that she has quit smoking. Her smoking use included Cigarettes. She smoked 0.00 packs per day. She has never used smokeless tobacco. She reports that  drinks alcohol. Her drug history is not on file.  Allergies: No Known Allergies  Medications Prior to Admission  Medication Sig Dispense Refill  . Calcium Carbonate Antacid (TUMS PO) Take 2 tablets by mouth as needed (heart burn).      . cetirizine (ZYRTEC) 10 MG tablet Take 1 tablet (10 mg  total) by mouth daily.  30 tablet  11  . drospirenone-ethinyl estradiol (GIANVI) 3-0.02 MG tablet Take 1 tablet by mouth daily.  1 Package  11  . HYDROcodone-acetaminophen (NORCO/VICODIN) 5-325 MG per tablet Take 1 tablet by mouth every 6 (six) hours as needed for pain.  30 tablet  0  . ondansetron (ZOFRAN ODT) 8 MG disintegrating tablet Take 1 tablet (8 mg total) by mouth every 8 (eight) hours as needed for nausea.  20 tablet  0  . sertraline (ZOLOFT) 100 MG tablet Take 1 tablet (100 mg total) by mouth daily.  30 tablet  11    Results for orders placed during the hospital encounter of 03/15/13 (from the past 48 hour(s))  CBC WITH DIFFERENTIAL     Status: Abnormal   Collection Time    03/15/13  4:53 PM      Result Value Range   WBC 17.6 (*) 4.0 - 10.5 K/uL   RBC 4.39  3.87 - 5.11 MIL/uL   Hemoglobin 13.6  12.0 - 15.0 g/dL   HCT 16.1  09.6 - 04.5 %   MCV 88.4  78.0 - 100.0 fL   MCH 31.0  26.0 - 34.0 pg   MCHC 35.1  30.0 - 36.0 g/dL   RDW 40.9  81.1 - 91.4 %   Platelets 350  150 - 400 K/uL   Neutrophils Relative % 78 (*) 43 - 77 %   Neutro Abs 13.8 (*) 1.7 -  7.7 K/uL   Lymphocytes Relative 16  12 - 46 %   Lymphs Abs 2.9  0.7 - 4.0 K/uL   Monocytes Relative 5  3 - 12 %   Monocytes Absolute 0.9  0.1 - 1.0 K/uL   Eosinophils Relative 0  0 - 5 %   Eosinophils Absolute 0.0  0.0 - 0.7 K/uL   Basophils Relative 0  0 - 1 %   Basophils Absolute 0.0  0.0 - 0.1 K/uL  COMPREHENSIVE METABOLIC PANEL     Status: Abnormal   Collection Time    03/15/13  4:53 PM      Result Value Range   Sodium 138  135 - 145 mEq/L   Potassium 3.6  3.5 - 5.1 mEq/L   Chloride 99  96 - 112 mEq/L   CO2 23  19 - 32 mEq/L   Glucose, Bld 109 (*) 70 - 99 mg/dL   BUN 9  6 - 23 mg/dL   Creatinine, Ser 1.61  0.50 - 1.10 mg/dL   Calcium 9.6  8.4 - 09.6 mg/dL   Total Protein 8.1  6.0 - 8.3 g/dL   Albumin 3.9  3.5 - 5.2 g/dL   AST 38 (*) 0 - 37 U/L   ALT 43 (*) 0 - 35 U/L   Alkaline Phosphatase 63  39 - 117 U/L    Total Bilirubin 0.2 (*) 0.3 - 1.2 mg/dL   GFR calc non Af Amer >90  >90 mL/min   GFR calc Af Amer >90  >90 mL/min   Comment: (NOTE)     The eGFR has been calculated using the CKD EPI equation.     This calculation has not been validated in all clinical situations.     eGFR's persistently <90 mL/min signify possible Chronic Kidney     Disease.  LIPASE, BLOOD     Status: None   Collection Time    03/15/13  4:53 PM      Result Value Range   Lipase 21  11 - 59 U/L  LACTIC ACID, PLASMA     Status: Abnormal   Collection Time    03/15/13  5:19 PM      Result Value Range   Lactic Acid, Venous 4.1 (*) 0.5 - 2.2 mmol/L  URINALYSIS, ROUTINE W REFLEX MICROSCOPIC     Status: None   Collection Time    03/15/13  5:27 PM      Result Value Range   Color, Urine YELLOW  YELLOW   APPearance CLEAR  CLEAR   Specific Gravity, Urine 1.023  1.005 - 1.030   pH 6.5  5.0 - 8.0   Glucose, UA NEGATIVE  NEGATIVE mg/dL   Hgb urine dipstick NEGATIVE  NEGATIVE   Bilirubin Urine NEGATIVE  NEGATIVE   Ketones, ur NEGATIVE  NEGATIVE mg/dL   Protein, ur NEGATIVE  NEGATIVE mg/dL   Urobilinogen, UA 0.2  0.0 - 1.0 mg/dL   Nitrite NEGATIVE  NEGATIVE   Leukocytes, UA NEGATIVE  NEGATIVE   Comment: MICROSCOPIC NOT DONE ON URINES WITH NEGATIVE PROTEIN, BLOOD, LEUKOCYTES, NITRITE, OR GLUCOSE <1000 mg/dL.  PREGNANCY, URINE     Status: None   Collection Time    03/15/13  5:27 PM      Result Value Range   Preg Test, Ur NEGATIVE  NEGATIVE   Comment:            THE SENSITIVITY OF THIS     METHODOLOGY IS >20 mIU/mL.   US Abdomen Complete  03/15/2013   *RADIOLOGY REPORT*  Clinical Data:  32 year old female with severe abdominal pain, nausea and vomiting.  ABDOMINAL ULTRASOUND COMPLETE  Comparison:  None  Findings:  Gallbladder: Multiple mobile gallstones are identified.  A 6 mm gallstone in the gallbladder neck does not appear to move.  There is no evidence of gallbladder wall thickening.  An equivocal sonographic Murphy's  sign is noted.  Common Bile Duct:  There is no evidence of intrahepatic or extrahepatic biliary dilation. The CBD measures 3.0 mm in greatest diameter.  Liver: Diffusely increased echogenicity of the liver is compatible with fatty infiltration.  No focal abnormalities are identified.  IVC:  Appears normal.  Pancreas:  Although the pancreas is difficult to visualize in its entirety, no focal pancreatic abnormality is identified.  Spleen:  Within normal limits in size and echotexture.  Right kidney:  The right kidney is normal in size and parenchymal echogenicity.  There is no evidence of solid mass, hydronephrosis or definite renal calculi.  The right kidney measures 11.7 cm.  Left kidney:  The left kidney is normal in size and parenchymal echogenicity.  There is no evidence of solid mass, hydronephrosis or definite renal calculi.   The left kidney measures 11.7 cm.  Abdominal Aorta:  No abdominal aortic aneurysm identified.  There is no evidence of ascites.  IMPRESSION: Cholelithiasis with equivocal findings for acute cholecystitis.  6 mm gallstone appears lodged in the gallbladder neck.  No evidence of biliary dilatation.  Fatty infiltration of the liver.   Original Report Authenticated By: Harmon Pier, M.D.    Review of Systems  Constitutional: Positive for chills. Negative for fever and weight loss.  HENT: Negative for nosebleeds.   Eyes: Negative for blurred vision.  Respiratory: Negative for shortness of breath.   Cardiovascular: Negative for chest pain, palpitations, orthopnea and PND.       Denies DOE  Gastrointestinal: Positive for nausea, vomiting and abdominal pain.  Genitourinary: Negative for dysuria and hematuria.  Musculoskeletal: Negative.   Skin: Negative for itching and rash.  Neurological: Negative for dizziness, focal weakness, seizures, loss of consciousness and headaches.       Denies TIAs, amaurosis fugax  Endo/Heme/Allergies: Does not bruise/bleed easily.   Psychiatric/Behavioral: The patient is not nervous/anxious.     Blood pressure 136/82, pulse 71, temperature 98.2 F (36.8 C), temperature source Oral, resp. rate 16, height 5\' 3"  (1.6 m), weight 228 lb 6.3 oz (103.6 kg), SpO2 98.00%. Physical Exam  Vitals reviewed. Constitutional: She is oriented to person, place, and time. She appears well-developed and well-nourished. No distress.  HENT:  Head: Normocephalic and atraumatic.  Right Ear: External ear normal.  Left Ear: External ear normal.  Eyes: Conjunctivae are normal. No scleral icterus.  Neck: Normal range of motion. Neck supple. No tracheal deviation present. No thyromegaly present.  Cardiovascular: Normal rate and normal heart sounds.   Respiratory: Effort normal and breath sounds normal. No stridor. No respiratory distress. She has no wheezes.  GI: Soft. She exhibits no distension. There is tenderness in the right upper quadrant and epigastric area. There is no rebound and no guarding.  Musculoskeletal: She exhibits no edema and no tenderness.  Lymphadenopathy:    She has no cervical adenopathy.  Neurological: She is alert and oriented to person, place, and time. She exhibits normal muscle tone.  Skin: Skin is warm and dry. No rash noted. She is not diaphoretic. No erythema. No pallor.  Psychiatric: She has a normal mood and affect. Her behavior  is normal. Judgment and thought content normal.     Assessment/Plan Acute calculous cholecystitis Obesity Elevated BP Dehydration   I believe the patient's symptoms are consistent with gallbladder disease.  We discussed gallbladder disease. The patient was given Agricultural engineer. We discussed non-operative and operative management. We discussed the signs & symptoms of acute cholecystitis  I discussed laparoscopic cholecystectomy with IOC in detail.  The patient was given educational material as well as diagrams detailing the procedure.  We discussed the risks and benefits of  a laparoscopic cholecystectomy including, but not limited to bleeding, infection, injury to surrounding structures such as the intestine or liver, bile leak, retained gallstones, need to convert to an open procedure, prolonged diarrhea, blood clots such as  DVT, common bile duct injury, anesthesia risks, and possible need for additional procedures.  We discussed the typical post-operative recovery course. I explained that the likelihood of improvement of their symptoms is good.  I explained that she is at higher risk for conversion and infection and injury to other structures since it is infected.   Mary Sella. Andrey Campanile, MD, FACS General, Bariatric, & Minimally Invasive Surgery [facility], [place]   [facility] M 03/15/2013, 9:12 PM

## 2013-03-15 NOTE — ED Provider Notes (Signed)
Patient seen/examined in the Emergency Department in conjunction with Midlevel Provider [place] Patient reports abd pain Exam : RUQ tenderness Plan:  Will need surgical consult for possible cholecystitis   Joya Gaskins, MD 03/15/13 1742

## 2013-03-15 NOTE — Progress Notes (Signed)
  Subjective:    Patient ID: Joan Atkinson, female    DOB: January 07, 1981, 32 y.o.   MRN: [id number]  HPI abd pain/vomiting- 'severe abdominal pain- it hurts so bad'.  Woke pt at 2 am.  Took Tums and Pepto w/out relief.  Pain is epigastric.  Pain is constant.  Vomiting x2-3.  No fever.  No diarrhea.  No new or different foods.  No ETOH.  No known sick contacts.  No radiation of pain to shoulder.  + sweating.   Review of Systems For ROS see HPI     Objective:   Physical Exam  Vitals reviewed. Constitutional: She is oriented to person, place, and time. She appears well-developed and well-nourished. She appears distressed (pt obviously uncomfortable, diaphoretic).  Neck: Normal range of motion. Neck supple.  Cardiovascular: Normal rate, regular rhythm and normal heart sounds.   Pulmonary/Chest: Effort normal and breath sounds normal. No respiratory distress. She has no wheezes. She has no rales.  Abdominal: Soft. Bowel sounds are normal. She exhibits no distension. There is tenderness (RUQ and epigastric TTP). There is no rebound and no guarding.  Lymphadenopathy:    She has no cervical adenopathy.  Neurological: She is alert and oriented to person, place, and time.  Skin: Skin is warm.          Assessment & Plan:

## 2013-03-15 NOTE — ED Notes (Addendum)
Pt woke this am with severe upper abd pain, called her doctor who ordered an ultrasound that showed gallstones and her doctor told her to come to er for admission. Pt took a vicodin at 1130 today which has helped the pain

## 2013-03-16 ENCOUNTER — Inpatient Hospital Stay (HOSPITAL_COMMUNITY): Payer: BC Managed Care – PPO | Admitting: Anesthesiology

## 2013-03-16 ENCOUNTER — Encounter (HOSPITAL_COMMUNITY): Payer: Self-pay | Admitting: General Practice

## 2013-03-16 ENCOUNTER — Encounter (HOSPITAL_COMMUNITY): Payer: Self-pay | Admitting: Anesthesiology

## 2013-03-16 ENCOUNTER — Encounter (HOSPITAL_COMMUNITY)
Admission: EM | Disposition: A | Discharge status: Home / Self Care | Payer: Self-pay | Source: Home / Self Care | Attending: Emergency Medicine

## 2013-03-16 ENCOUNTER — Inpatient Hospital Stay (HOSPITAL_COMMUNITY): Payer: BC Managed Care – PPO

## 2013-03-16 HISTORY — PX: CHOLECYSTECTOMY: SHX55

## 2013-03-16 LAB — CBC
HCT: 35.9 % — ABNORMAL LOW (ref 36.0–46.0)
Hemoglobin: 12.1 g/dL (ref 12.0–15.0)
MCH: 30.1 pg (ref 26.0–34.0)
MCV: 89.3 fL (ref 78.0–100.0)
MCV: 90 fL (ref 78.0–100.0)
Platelets: 284 10*3/uL (ref 150–400)
RBC: 4.02 MIL/uL (ref 3.87–5.11)
RDW: 12.8 % (ref 11.5–15.5)
WBC: 12.2 10*3/uL — ABNORMAL HIGH (ref 4.0–10.5)
WBC: 13 10*3/uL — ABNORMAL HIGH (ref 4.0–10.5)

## 2013-03-16 LAB — COMPREHENSIVE METABOLIC PANEL
AST: 26 U/L (ref 0–37)
Albumin: 3.2 g/dL — ABNORMAL LOW (ref 3.5–5.2)
BUN: 6 mg/dL (ref 6–23)
Calcium: 8.6 mg/dL (ref 8.4–10.5)
Creatinine, Ser: 0.59 mg/dL (ref 0.50–1.10)
Total Protein: 6.3 g/dL (ref 6.0–8.3)

## 2013-03-16 LAB — CREATININE, SERUM: GFR calc Af Amer: >90 mL/min (ref >90)

## 2013-03-16 SURGERY — LAPAROSCOPIC CHOLECYSTECTOMY WITH INTRAOPERATIVE CHOLANGIOGRAM
Anesthesia: General | Site: Abdomen | Wound class: Contaminated

## 2013-03-16 MED ORDER — BUPIVACAINE-EPINEPHRINE PF 0.25-1:200000 % IJ SOLN
INTRAMUSCULAR | Status: AC
  Filled 2013-03-16: qty 30

## 2013-03-16 MED ORDER — PANTOPRAZOLE SODIUM 40 MG PO TBEC
40.0000 mg | DELAYED_RELEASE_TABLET | Freq: Every day | ORAL | Status: DC
  Administered 2013-03-16 – 2013-03-17 (×2): 40 mg via ORAL
  Filled 2013-03-16: qty 1

## 2013-03-16 MED ORDER — BUPIVACAINE-EPINEPHRINE 0.25% -1:200000 IJ SOLN
INTRAMUSCULAR | Status: DC | PRN
  Administered 2013-03-16: 16 mL

## 2013-03-16 MED ORDER — KETOROLAC TROMETHAMINE 30 MG/ML IJ SOLN
INTRAMUSCULAR | Status: DC | PRN
  Administered 2013-03-16: 30 mg via INTRAVENOUS

## 2013-03-16 MED ORDER — HYDROMORPHONE HCL PF 1 MG/ML IJ SOLN: 0.2500 mg | INTRAMUSCULAR | Status: DC | PRN

## 2013-03-16 MED ORDER — HEPARIN SODIUM (PORCINE) 5000 UNIT/ML IJ SOLN
5000.0000 [IU] | Freq: Three times a day (TID) | INTRAMUSCULAR | Status: DC
  Administered 2013-03-17: 5000 [IU] via SUBCUTANEOUS
  Filled 2013-03-16 (×3): qty 1

## 2013-03-16 MED ORDER — GLYCOPYRROLATE 0.2 MG/ML IJ SOLN
INTRAMUSCULAR | Status: DC | PRN
  Administered 2013-03-16: 0.4 mg via INTRAVENOUS
  Administered 2013-03-16 (×2): 0.2 mg via INTRAVENOUS

## 2013-03-16 MED ORDER — LACTATED RINGERS IV SOLN
INTRAVENOUS | Status: DC | PRN
  Administered 2013-03-16 (×2): via INTRAVENOUS

## 2013-03-16 MED ORDER — PHENYLEPHRINE HCL 10 MG/ML IJ SOLN
INTRAMUSCULAR | Status: DC | PRN
  Administered 2013-03-16: 80 ug via INTRAVENOUS

## 2013-03-16 MED ORDER — MIDAZOLAM HCL 5 MG/5ML IJ SOLN
INTRAMUSCULAR | Status: DC | PRN
  Administered 2013-03-16: 2 mg via INTRAVENOUS

## 2013-03-16 MED ORDER — 0.9 % SODIUM CHLORIDE (POUR BTL) OPTIME
TOPICAL | Status: DC | PRN
  Administered 2013-03-16: 1000 mL

## 2013-03-16 MED ORDER — OXYCODONE-ACETAMINOPHEN 5-325 MG PO TABS
1.0000 | ORAL_TABLET | ORAL | Status: DC | PRN
  Administered 2013-03-16 – 2013-03-17 (×4): 2 via ORAL
  Filled 2013-03-16 (×4): qty 2

## 2013-03-16 MED ORDER — NEOSTIGMINE METHYLSULFATE 1 MG/ML IJ SOLN
INTRAMUSCULAR | Status: DC | PRN
  Administered 2013-03-16: 3 mg via INTRAVENOUS

## 2013-03-16 MED ORDER — LACTATED RINGERS IV SOLN
INTRAVENOUS | Status: DC
  Administered 2013-03-16: 10:00:00 via INTRAVENOUS

## 2013-03-16 MED ORDER — LIDOCAINE HCL (CARDIAC) 20 MG/ML IV SOLN
INTRAVENOUS | Status: DC | PRN
  Administered 2013-03-16: 50 mg via INTRAVENOUS

## 2013-03-16 MED ORDER — SERTRALINE HCL 100 MG PO TABS
100.0000 mg | ORAL_TABLET | Freq: Every day | ORAL | Status: DC
  Administered 2013-03-16 – 2013-03-17 (×2): 100 mg via ORAL
  Filled 2013-03-16 (×2): qty 1

## 2013-03-16 MED ORDER — ONDANSETRON HCL 4 MG/2ML IJ SOLN
INTRAMUSCULAR | Status: DC | PRN
  Administered 2013-03-16: 4 mg via INTRAVENOUS

## 2013-03-16 MED ORDER — PROMETHAZINE HCL 25 MG/ML IJ SOLN: 6.2500 mg | INTRAMUSCULAR | Status: DC | PRN

## 2013-03-16 MED ORDER — OXYCODONE HCL 5 MG PO TABS: 5.0000 mg | ORAL_TABLET | Freq: Once | ORAL | Status: DC | PRN

## 2013-03-16 MED ORDER — ROCURONIUM BROMIDE 100 MG/10ML IV SOLN
INTRAVENOUS | Status: DC | PRN
  Administered 2013-03-16: 40 mg via INTRAVENOUS

## 2013-03-16 MED ORDER — MEPERIDINE HCL 25 MG/ML IJ SOLN: 6.2500 mg | INTRAMUSCULAR | Status: DC | PRN

## 2013-03-16 MED ORDER — MORPHINE SULFATE 10 MG/ML IJ SOLN
INTRAMUSCULAR | Status: DC | PRN
  Administered 2013-03-16: 10 mg via INTRAVENOUS

## 2013-03-16 MED ORDER — OXYCODONE HCL 5 MG/5ML PO SOLN: 5.0000 mg | Freq: Once | ORAL | Status: DC | PRN

## 2013-03-16 MED ORDER — SODIUM CHLORIDE 0.9 % IR SOLN
Status: DC | PRN
  Administered 2013-03-16: 1

## 2013-03-16 MED ORDER — PROPOFOL 10 MG/ML IV BOLUS
INTRAVENOUS | Status: DC | PRN
  Administered 2013-03-16: 150 mg via INTRAVENOUS

## 2013-03-16 MED ORDER — FENTANYL CITRATE 0.05 MG/ML IJ SOLN
INTRAMUSCULAR | Status: DC | PRN
  Administered 2013-03-16 (×2): 100 ug via INTRAVENOUS
  Administered 2013-03-16: 50 ug via INTRAVENOUS

## 2013-03-16 MED ORDER — MORPHINE SULFATE 2 MG/ML IJ SOLN
1.0000 mg | INTRAMUSCULAR | Status: DC | PRN
  Administered 2013-03-16: 4 mg via INTRAVENOUS
  Filled 2013-03-16: qty 2

## 2013-03-16 MED ORDER — SODIUM CHLORIDE 0.9 % IV SOLN
INTRAVENOUS | Status: DC | PRN
  Administered 2013-03-16: 12:00:00

## 2013-03-16 MED ORDER — PIPERACILLIN-TAZOBACTAM 3.375 G IVPB
3.3750 g | Freq: Three times a day (TID) | INTRAVENOUS | Status: AC
  Administered 2013-03-16 – 2013-03-17 (×3): 3.375 g via INTRAVENOUS
  Filled 2013-03-16 (×3): qty 50

## 2013-03-16 SURGICAL SUPPLY — 43 items
APPLIER CLIP ROT 10 11.4 M/L (STAPLE) ×2
BENZOIN TINCTURE PRP APPL 2/3 (GAUZE/BANDAGES/DRESSINGS) ×2 IMPLANT
BLADE SURG ROTATE 9660 (MISCELLANEOUS) IMPLANT
CANISTER SUCTION 2500CC (MISCELLANEOUS) ×2 IMPLANT
CHLORAPREP W/TINT 26ML (MISCELLANEOUS) ×2 IMPLANT
CLIP APPLIE ROT 10 11.4 M/L (STAPLE) ×1 IMPLANT
CLOTH BEACON ORANGE TIMEOUT ST (SAFETY) ×2 IMPLANT
COVER MAYO STAND STRL (DRAPES) ×2 IMPLANT
COVER SURGICAL LIGHT HANDLE (MISCELLANEOUS) ×2 IMPLANT
DECANTER SPIKE VIAL GLASS SM (MISCELLANEOUS) ×4 IMPLANT
DRAPE C-ARM 42X72 X-RAY (DRAPES) ×2 IMPLANT
DRAPE UTILITY 15X26 W/TAPE STR (DRAPE) ×4 IMPLANT
DRSG TEGADERM 4X4.75 (GAUZE/BANDAGES/DRESSINGS) ×2 IMPLANT
ELECT REM PT RETURN 9FT ADLT (ELECTROSURGICAL) ×2
ELECTRODE REM PT RTRN 9FT ADLT (ELECTROSURGICAL) ×1 IMPLANT
FILTER SMOKE EVAC LAPAROSHD (FILTER) ×2 IMPLANT
GAUZE SPONGE 2X2 8PLY STRL LF (GAUZE/BANDAGES/DRESSINGS) ×1 IMPLANT
GLOVE BIO SURGEON STRL SZ7 (GLOVE) ×2 IMPLANT
GLOVE BIO SURGEON STRL SZ7.5 (GLOVE) ×2 IMPLANT
GLOVE BIOGEL PI IND STRL 7.0 (GLOVE) ×1 IMPLANT
GLOVE BIOGEL PI IND STRL 7.5 (GLOVE) ×2 IMPLANT
GLOVE BIOGEL PI INDICATOR 7.0 (GLOVE) ×1
GLOVE BIOGEL PI INDICATOR 7.5 (GLOVE) ×2
GLOVE SURG SS PI 7.0 STRL IVOR (GLOVE) ×2 IMPLANT
GOWN STRL NON-REIN LRG LVL3 (GOWN DISPOSABLE) ×8 IMPLANT
KIT BASIN OR (CUSTOM PROCEDURE TRAY) ×2 IMPLANT
KIT ROOM TURNOVER OR (KITS) ×2 IMPLANT
NS IRRIG 1000ML POUR BTL (IV SOLUTION) ×2 IMPLANT
PAD ARMBOARD 7.5X6 YLW CONV (MISCELLANEOUS) ×2 IMPLANT
POUCH SPECIMEN RETRIEVAL 10MM (ENDOMECHANICALS) ×2 IMPLANT
SCISSORS LAP 5X35 DISP (ENDOMECHANICALS) IMPLANT
SET CHOLANGIOGRAPH 5 50 .035 (SET/KITS/TRAYS/PACK) ×2 IMPLANT
SET IRRIG TUBING LAPAROSCOPIC (IRRIGATION / IRRIGATOR) ×2 IMPLANT
SLEEVE ENDOPATH XCEL 5M (ENDOMECHANICALS) ×2 IMPLANT
SPECIMEN JAR SMALL (MISCELLANEOUS) ×2 IMPLANT
SPONGE GAUZE 2X2 STER 10/PKG (GAUZE/BANDAGES/DRESSINGS) ×1
SUT MNCRL AB 4-0 PS2 18 (SUTURE) ×2 IMPLANT
TOWEL OR 17X24 6PK STRL BLUE (TOWEL DISPOSABLE) ×2 IMPLANT
TOWEL OR 17X26 10 PK STRL BLUE (TOWEL DISPOSABLE) ×2 IMPLANT
TRAY LAPAROSCOPIC (CUSTOM PROCEDURE TRAY) ×2 IMPLANT
TROCAR XCEL BLUNT TIP 100MML (ENDOMECHANICALS) ×2 IMPLANT
TROCAR XCEL NON-BLD 11X100MML (ENDOMECHANICALS) ×2 IMPLANT
TROCAR XCEL NON-BLD 5MMX100MML (ENDOMECHANICALS) ×2 IMPLANT

## 2013-03-16 NOTE — Progress Notes (Signed)
Subjective: Still sore and tender, no nausea.  Dr. Corliss Skains has seen her and plans surgery this AM.  She is ready.  Objective: Vital signs in last 24 hours: Temp:  [98.1 F (36.7 C)-99.1 F (37.3 C)] 98.1 F (36.7 C) (08/19 0607) Pulse Rate:  [60-77] 64 (08/19 0607) Resp:  [12-18] 18 (08/19 0607) BP: (133-147)/(75-97) 133/83 mmHg (08/19 0607) SpO2:  [97 %-100 %] 98 % (08/19 0607) Weight:  [102.059 kg (225 lb)-103.6 kg (228 lb 6.3 oz)] 103.6 kg (228 lb 6.3 oz) (08/18 1903) Last BM Date: 03/15/13 NPO TM99.1 WBC is better, LFT's are normal Abd US shows: multiple gallstones with 6 MM gallstones Intake/Output from previous day: 08/18 0701 - 08/19 0700 In: 979.2 [I.V.:929.2; IV Piggyback:50] Out: -  Intake/Output this shift:    General appearance: alert, cooperative and no distress GI: soft tender mid  upper abdomen. No distension + BS.  Lab Results:   Recent Labs  03/15/13 1653 03/16/13 0447  WBC 17.6* 12.2*  HGB 13.6 12.1  HCT 38.8 35.9*  PLT 350 284    BMET  Recent Labs  03/15/13 1653 03/16/13 0447  NA 138 138  K 3.6 3.7  CL 99 104  CO2 23 23  GLUCOSE 109* 131*  BUN 9 6  CREATININE 0.54 0.59  CALCIUM 9.6 8.6   PT/INR No results found for this basename: LABPROT, INR,  in the last 72 hours   Recent Labs Lab 03/15/13 1020 03/15/13 1653 03/16/13 0447  AST 47* 38* 26  ALT 47* 43* 32  ALKPHOS 56 63 54  BILITOT 0.4 0.2* 0.3  PROT 8.0 8.1 6.3  ALBUMIN 4.1 3.9 3.2*     Lipase     Component Value Date/Time   LIPASE 21 03/15/2013 1653     Studies/Results: US Abdomen Complete  03/15/2013   *RADIOLOGY REPORT*  Clinical Data:  32 year old female with severe abdominal pain, nausea and vomiting.  ABDOMINAL ULTRASOUND COMPLETE  Comparison:  None  Findings:  Gallbladder: Multiple mobile gallstones are identified.  A 6 mm gallstone in the gallbladder neck does not appear to move.  There is no evidence of gallbladder wall thickening.  An equivocal sonographic  Murphy's sign is noted.  Common Bile Duct:  There is no evidence of intrahepatic or extrahepatic biliary dilation. The CBD measures 3.0 mm in greatest diameter.  Liver: Diffusely increased echogenicity of the liver is compatible with fatty infiltration.  No focal abnormalities are identified.  IVC:  Appears normal.  Pancreas:  Although the pancreas is difficult to visualize in its entirety, no focal pancreatic abnormality is identified.  Spleen:  Within normal limits in size and echotexture.  Right kidney:  The right kidney is normal in size and parenchymal echogenicity.  There is no evidence of solid mass, hydronephrosis or definite renal calculi.  The right kidney measures 11.7 cm.  Left kidney:  The left kidney is normal in size and parenchymal echogenicity.  There is no evidence of solid mass, hydronephrosis or definite renal calculi.   The left kidney measures 11.7 cm.  Abdominal Aorta:  No abdominal aortic aneurysm identified.  There is no evidence of ascites.  IMPRESSION: Cholelithiasis with equivocal findings for acute cholecystitis.  6 mm gallstone appears lodged in the gallbladder neck.  No evidence of biliary dilatation.  Fatty infiltration of the liver.   Original Report Authenticated By: Harmon Pier, M.D.    Medications: . pantoprazole (PROTONIX) IV  40 mg Intravenous QHS  . piperacillin-tazobactam (ZOSYN)  IV  3.375  g Intravenous Q8H    Assessment/Plan Acute calculous cholecystitis  Obesity  Elevated BP  Dehydration  Plan:  Surgery later this AM.      LOS: 1 day    Muhsin Doris 03/16/2013

## 2013-03-16 NOTE — Anesthesia Preprocedure Evaluation (Addendum)
Anesthesia Evaluation  Patient identified by MRN, date of birth, ID band Patient awake    Reviewed: Allergy & Precautions, H&P , NPO status , Patient's Chart, lab work & pertinent test results  Airway Mallampati: II TM Distance: >3 FB Neck ROM: Full    Dental  (+) Dental Advisory Given and Teeth Intact   Pulmonary asthma ,  breath sounds clear to auscultation        Cardiovascular negative cardio ROS  Rhythm:Regular Rate:Normal     Neuro/Psych PSYCHIATRIC DISORDERS Depression negative neurological ROS     GI/Hepatic negative GI ROS, Neg liver ROS,   Endo/Other  negative endocrine ROSMorbid obesity  Renal/GU      Musculoskeletal negative musculoskeletal ROS (+)   Abdominal (+) + obese,   Peds  Hematology negative hematology ROS (+)   Anesthesia Other Findings   Reproductive/Obstetrics negative OB ROS                          Anesthesia Physical Anesthesia Plan  ASA: III  Anesthesia Plan: General   Post-op Pain Management:    Induction: Intravenous  Airway Management Planned: Oral ETT  Additional Equipment:   Intra-op Plan:   Post-operative Plan: Extubation in OR  Informed Consent: I have reviewed the patients History and Physical, chart, labs and discussed the procedure including the risks, benefits and alternatives for the proposed anesthesia with the patient or authorized representative who has indicated his/her understanding and acceptance.   Dental advisory given  Plan Discussed with: CRNA  Anesthesia Plan Comments:         Anesthesia Quick Evaluation

## 2013-03-16 NOTE — Progress Notes (Signed)
Discussed with patient.  Plan lap chole with IOC.  The surgical procedure has been discussed with the patient.  Potential risks, benefits, alternative treatments, and expected outcomes have been explained.  All of the patient's questions at this time have been answered.  The likelihood of reaching the patient's treatment goal is good.  The patient understand the proposed surgical procedure and wishes to proceed.

## 2013-03-16 NOTE — Preoperative (Signed)
Beta Blockers   Reason not to administer Beta Blockers:Not Applicable 

## 2013-03-16 NOTE — Transfer of Care (Signed)
Immediate Anesthesia Transfer of Care Note  Patient: Joan Atkinson  Procedure(s) Performed: Procedure(s): LAPAROSCOPIC CHOLECYSTECTOMY WITH INTRAOPERATIVE CHOLANGIOGRAM (N/A)  Patient Location: PACU  Anesthesia Type:General  Level of Consciousness: awake, alert  and oriented  Airway & Oxygen Therapy: Patient Spontanous Breathing and Patient connected to face mask oxygen  Post-op Assessment: Report given to PACU RN, Post -op Vital signs reviewed and stable and Patient moving all extremities X 4  Post vital signs: Reviewed and stable  Complications: No apparent anesthesia complications

## 2013-03-16 NOTE — Op Note (Signed)
Laparoscopic Cholecystectomy with IOC Procedure Note  Indications: This patient presents with symptomatic gallbladder disease and will undergo laparoscopic cholecystectomy.  Pre-operative Diagnosis: Calculus of gallbladder with acute cholecystitis, without mention of obstruction  Post-operative Diagnosis: Same  Surgeon: Vaidehi Braddy K.   Assistants: Cyndia Bent, MD  Anesthesia: General endotracheal anesthesia  ASA Class: 2  Procedure Details  The patient was seen again in the Holding Room. The risks, benefits, complications, treatment options, and expected outcomes were discussed with the patient. The possibilities of reaction to medication, pulmonary aspiration, perforation of viscus, bleeding, recurrent infection, finding a normal gallbladder, the need for additional procedures, failure to diagnose a condition, the possible need to convert to an open procedure, and creating a complication requiring transfusion or operation were discussed with the patient. The likelihood of improving the patient's symptoms with return to their baseline status is good.  The patient and/or family concurred with the proposed plan, giving informed consent. The site of surgery properly noted. The patient was taken to Operating Room, identified as Joan Atkinson and the procedure verified as Laparoscopic Cholecystectomy with Intraoperative Cholangiogram. A Time Out was held and the above information confirmed.  Prior to the induction of general anesthesia, antibiotic prophylaxis was administered. General endotracheal anesthesia was then administered and tolerated well. After the induction, the abdomen was prepped with Chloraprep and draped in the sterile fashion. The patient was positioned in the supine position.  Local anesthetic agent was injected into the skin near the umbilicus and an incision made. We dissected down to the abdominal fascia with blunt dissection.  The fascia was incised vertically and we  entered the peritoneal cavity bluntly.  A pursestring suture of 0-Vicryl was placed around the fascial opening.  The Hasson cannula was inserted and secured with the stay suture.  Pneumoperitoneum was then created with CO2 and tolerated well without any adverse changes in the patient's vital signs. An 11-mm port was placed in the subxiphoid position.  Two 5-mm ports were placed in the right upper quadrant. All skin incisions were infiltrated with a local anesthetic agent before making the incision and placing the trocars.   We positioned the patient in reverse Trendelenburg, tilted slightly to the patient's left.  The gallbladder was identified and was very edematous and thickened.  The fundus was grasped and retracted cephalad. Adhesions were lysed bluntly and with the electrocautery where indicated, taking care not to injure any adjacent organs or viscus. The infundibulum was grasped and retracted laterally, exposing the peritoneum overlying the triangle of Calot. This was then divided and exposed in a blunt fashion. A critical view of the cystic duct and cystic artery was obtained.  The cystic duct was clearly identified and bluntly dissected circumferentially. The cystic duct was ligated with a clip distally.   An incision was made in the cystic duct and the [facility] cholangiogram catheter introduced. The catheter was secured using a clip. A cholangiogram was then obtained which showed good visualization of the distal and proximal biliary tree with no sign of filling defects or obstruction.  Contrast flowed easily into the duodenum. The catheter was then removed.   The cystic duct was then ligated with clips and divided. The cystic artery was identified, dissected free, ligated with clips and divided as well.   The gallbladder was dissected from the liver bed in retrograde fashion with the electrocautery. The gallbladder was removed and placed in an Endocatch sac. The liver bed was irrigated and inspected.  Hemostasis was achieved with the electrocautery. Copious  irrigation was utilized and was repeatedly aspirated until clear.  The gallbladder and Endocatch sac were then removed through the umbilical port site.  We had to enlarge the fascial opening slightly.  The pursestring suture was used to close the umbilical fascia.    We again inspected the right upper quadrant for hemostasis.  Pneumoperitoneum was released as we removed the trocars.  4-0 Monocryl was used to close the skin.   Benzoin, steri-strips, and clean dressings were applied. The patient was then extubated and brought to the recovery room in stable condition. Instrument, sponge, and needle counts were correct at closure and at the conclusion of the case.   Findings: Cholecystitis with Cholelithiasis  Estimated Blood Loss: Minimal         Drains: none         Specimens: Gallbladder           Complications: None; patient tolerated the procedure well.         Disposition: PACU - hemodynamically stable.         Condition: stable  Joan Atkinson. Joan Skains, MD, [facility] Surgery  General/ Trauma Surgery  03/16/2013 12:10 PM

## 2013-03-17 ENCOUNTER — Encounter: Payer: Self-pay | Admitting: General Surgery

## 2013-03-17 ENCOUNTER — Encounter (HOSPITAL_COMMUNITY): Payer: Self-pay | Admitting: General Surgery

## 2013-03-17 DIAGNOSIS — K8000 Calculus of gallbladder with acute cholecystitis without obstruction: Principal | ICD-10-CM

## 2013-03-17 HISTORY — DX: Calculus of gallbladder with acute cholecystitis without obstruction: K80.00

## 2013-03-17 MED ORDER — IBUPROFEN 600 MG PO TABS
600.0000 mg | ORAL_TABLET | Freq: Four times a day (QID) | ORAL | Status: DC | PRN
Start: 2013-03-17 — End: 2013-03-17

## 2013-03-17 MED ORDER — OXYCODONE-ACETAMINOPHEN 5-325 MG PO TABS: 1.0000 | ORAL_TABLET | ORAL | Status: DC | PRN

## 2013-03-17 MED ORDER — IBUPROFEN 200 MG PO TABS: ORAL_TABLET | ORAL | Status: DC

## 2013-03-17 NOTE — Progress Notes (Signed)
1 Day Post-Op  Subjective: Feels better, still pretty sore.  Eating last Pm.    Objective: Vital signs in last 24 hours: Temp:  [97.2 F (36.2 C)-99.1 F (37.3 C)] 98.8 F (37.1 C) (08/20 0648) Pulse Rate:  [62-93] 74 (08/20 0648) Resp:  [13-18] 18 (08/20 0648) BP: (111-122)/(65-75) 111/70 mmHg (08/20 0648) SpO2:  [96 %-99 %] 96 % (08/20 0648) Last BM Date: 03/15/13 Diet: regular;  720 Po recorded Afebrile, VSS No labs Intake/Output from previous day: 08/19 0701 - 08/20 0700 In: 4398 [P.O.:720; I.V.:3678] Out: 1080 [Urine:1075; Blood:5] Intake/Output this shift:    General appearance: alert, cooperative and no distress Resp: clear to auscultation bilaterally GI: soft tender, + BS, incisions look fine.  Lab Results:   Recent Labs  03/16/13 0447 03/16/13 1553  WBC 12.2* 13.0*  HGB 12.1 12.2  HCT 35.9* 35.9*  PLT 284 275    BMET  Recent Labs  03/15/13 1653 03/16/13 0447 03/16/13 1553  NA 138 138  --   K 3.6 3.7  --   CL 99 104  --   CO2 23 23  --   GLUCOSE 109* 131*  --   BUN 9 6  --   CREATININE 0.54 0.59 0.54  CALCIUM 9.6 8.6  --    PT/INR No results found for this basename: LABPROT, INR,  in the last 72 hours   Recent Labs Lab 03/15/13 1020 03/15/13 1653 03/16/13 0447  AST 47* 38* 26  ALT 47* 43* 32  ALKPHOS 56 63 54  BILITOT 0.4 0.2* 0.3  PROT 8.0 8.1 6.3  ALBUMIN 4.1 3.9 3.2*     Lipase     Component Value Date/Time   LIPASE 21 03/15/2013 1653     Studies/Results: Dg Cholangiogram Operative  03/16/2013   *RADIOLOGY REPORT*  Clinical Data: Cholecystitis  INTRAOPERATIVE CHOLANGIOGRAM  Technique:  Multiple fluoroscopic spot radiographs were obtained during intraoperative cholangiogram and are submitted for interpretation post-operatively.  Comparison: None.  Findings: No persistent filling defects in the common duct. Intrahepatic ducts are incompletely visualized, appearing decompressed centrally. Contrast passes into the duodenum.   IMPRESSION  Negative for retained common duct stone.   Original Report Authenticated By: D. Andria Rhein, MD   US Abdomen Complete  03/15/2013   *RADIOLOGY REPORT*  Clinical Data:  32 year old female with severe abdominal pain, nausea and vomiting.  ABDOMINAL ULTRASOUND COMPLETE  Comparison:  None  Findings:  Gallbladder: Multiple mobile gallstones are identified.  A 6 mm gallstone in the gallbladder neck does not appear to move.  There is no evidence of gallbladder wall thickening.  An equivocal sonographic Murphy's sign is noted.  Common Bile Duct:  There is no evidence of intrahepatic or extrahepatic biliary dilation. The CBD measures 3.0 mm in greatest diameter.  Liver: Diffusely increased echogenicity of the liver is compatible with fatty infiltration.  No focal abnormalities are identified.  IVC:  Appears normal.  Pancreas:  Although the pancreas is difficult to visualize in its entirety, no focal pancreatic abnormality is identified.  Spleen:  Within normal limits in size and echotexture.  Right kidney:  The right kidney is normal in size and parenchymal echogenicity.  There is no evidence of solid mass, hydronephrosis or definite renal calculi.  The right kidney measures 11.7 cm.  Left kidney:  The left kidney is normal in size and parenchymal echogenicity.  There is no evidence of solid mass, hydronephrosis or definite renal calculi.   The left kidney measures 11.7 cm.  Abdominal  Aorta:  No abdominal aortic aneurysm identified.  There is no evidence of ascites.  IMPRESSION: Cholelithiasis with equivocal findings for acute cholecystitis.  6 mm gallstone appears lodged in the gallbladder neck.  No evidence of biliary dilatation.  Fatty infiltration of the liver.   Original Report Authenticated By: Harmon Pier, M.D.    Medications: . heparin subcutaneous  5,000 Units Subcutaneous Q8H  . pantoprazole  40 mg Oral Daily  . piperacillin-tazobactam (ZOSYN)  IV  3.375 g Intravenous Q8H  . sertraline  100 mg  Oral Daily    Assessment/Plan Calculus of gallbladder with acute cholecystitis, without mention of obstruction  s/p Laparoscopic Cholecystectomy with IOC , 03/16/2013,  Wilmon Arms. Tsuei, MD Asthma Hypertension ? Dehydration    Plan:  Mobilize and advance diet, home later today if she does well.   LOS: 2 days    Doing well plan home after lunch. Joan Atkinson 03/17/2013

## 2013-03-17 NOTE — Discharge Summary (Signed)
Physician Discharge Summary  Patient ID: Joan Atkinson MRN: [id number] DOB/AGE: Feb 23, 1981 32 y.o.  Admit date: 03/15/2013 Discharge date: 03/17/2013  Admission Diagnoses: Calculus of gallbladder with acute cholecystitis, without mention of obstruction  Asthma   Dehydration     Discharge Diagnoses:  Same Active Problems:   Calculus of gallbladder with acute cholecystitis, without mention of obstruction   PROCEDURES:  Laparoscopic Cholecystectomy with IOC , 03/16/2013, Wilmon Arms. Tsuei, MD     Hospital Course:  32 yo obese female otherwise in good health awoke at 0200 with acute epigastric pain and RUQ pain. Pain is constant and sharp. No prior symptoms. Subsequently developed nausea and vomiting. Some chills no fever. Went to [employer] and tried [employer] and pepto without relief. Saw PCP who ordered u/s and tests. Elevated WBC and gallstones on u/s. Was directed to ED. She was seen and admitted by Dr. Andrey Campanile in the late evening and seen the following AM by Dr Corliss Skains.  She was taken to the OR later the that day.  She has done well post op, tolerating PO's, ambulating and ready for d/c later the following AM. She will follow up with the clinic 04/06/13. Condition on D/C:  Improved.   Disposition: 01-Home or Self Care       Future Appointments Provider Department Dept Phone   04/06/2013 12:00 PM [facility] Surgery, [place] [phone]       Medication List    STOP taking these medications       HYDROcodone-acetaminophen 5-325 MG per tablet  Commonly known as:  NORCO/VICODIN      TAKE these medications       cetirizine 10 MG tablet  Commonly known as:  ZYRTEC  Take 1 tablet (10 mg total) by mouth daily.     drospirenone-ethinyl estradiol 3-0.02 MG tablet  Commonly known as:  GIANVI  Take 1 tablet by mouth daily.     ibuprofen 200 MG tablet  Commonly known as:  ADVIL,MOTRIN  You can take 2-3 tablets every 6 hours for pain as needed.     ondansetron 8 MG disintegrating tablet  Commonly known as:  ZOFRAN ODT  Take 1 tablet (8 mg total) by mouth every 8 (eight) hours as needed for nausea.     oxyCODONE-acetaminophen 5-325 MG per tablet  Commonly known as:  PERCOCET/ROXICET  Take 1-2 tablets by mouth every 4 (four) hours as needed.     sertraline 100 MG tablet  Commonly known as:  ZOLOFT  Take 1 tablet (100 mg total) by mouth daily.     TUMS PO  Take 2 tablets by mouth as needed (heart burn).       Follow-up Information   Follow up with [facility] [place] On 04/06/2013. (Be at the office at 11:30 for check in, your appointment is at 12:00.)    Contact information:   [address]   [place] [address] [phone]       Signed: Sherrie George 03/17/2013, 11:50 AM

## 2013-03-17 NOTE — Discharge Summary (Signed)
Wilmon Arms. Corliss Skains, MD, [facility] Surgery  General/ Trauma Surgery  03/17/2013 2:15 PM

## 2013-03-17 NOTE — ED Provider Notes (Signed)
Medical screening examination/treatment/procedure(s) were conducted as a shared visit with non-physician practitioner(s) and myself.  I personally evaluated the patient during the encounter  Pt stable in the ED I spoke to surgery who will admit patient Antibiotics started at request of surgeon   Joya Gaskins, MD 03/17/13 1350

## 2013-03-17 NOTE — Progress Notes (Signed)
Patient discharged to home with instructions. 

## 2013-03-17 NOTE — Progress Notes (Signed)
Ready for discharge.  Wilmon Arms. Corliss Skains, MD, [facility] Surgery  General/ Trauma Surgery  03/17/2013 2:14 PM

## 2013-04-03 ENCOUNTER — Other Ambulatory Visit: Payer: Self-pay | Admitting: Family Medicine

## 2013-04-06 ENCOUNTER — Ambulatory Visit (INDEPENDENT_AMBULATORY_CARE_PROVIDER_SITE_OTHER): Payer: BC Managed Care – PPO | Admitting: General Surgery

## 2013-04-06 ENCOUNTER — Encounter (INDEPENDENT_AMBULATORY_CARE_PROVIDER_SITE_OTHER): Payer: Self-pay

## 2013-04-06 VITALS — BP 124/86 | HR 70 | Temp 97.2°F | Ht 63.0 in | Wt 224.8 lb

## 2013-04-06 DIAGNOSIS — K8000 Calculus of gallbladder with acute cholecystitis without obstruction: Secondary | ICD-10-CM

## 2013-04-06 DIAGNOSIS — K8020 Calculus of gallbladder without cholecystitis without obstruction: Secondary | ICD-10-CM

## 2013-04-06 NOTE — Patient Instructions (Signed)
You may return to normal activities.  You do not have to follow up with [place] unless new or worsening problems develop related to your surgery.  Thank you for allowing [place] to be a part of your care!

## 2013-04-06 NOTE — Progress Notes (Signed)
Subjective: post op check/lap chole     Patient ID: Joan Atkinson, female   DOB: [birth year], 32 y.o.   MRN: [id number]  HPI 32 year old healthy female underwent a laparoscopic cholecystectomy by Dr. Corliss Skains on 03/16/13.  She has been doing quite well since discharge.  No concerns.  No diarrhea, n/v.  No longer taking any pain medication.   Review of Systems  Constitutional: Negative for fever and chills.  Gastrointestinal: Negative for abdominal pain, diarrhea and abdominal distention.       Objective:   Physical Exam  Constitutional: She appears well-developed and well-nourished. No distress.  Cardiovascular: Normal rate, regular rhythm and normal heart sounds.  Exam reveals no gallop.   No murmur heard. Pulmonary/Chest: Effort normal and breath sounds normal. She has no rales.  Abdominal: Soft. Bowel sounds are normal. She exhibits no distension and no mass. There is no tenderness. There is no rebound and no guarding.  Well healed incisions.    Skin: She is not diaphoretic.       Assessment:    acute cholecystitis Cholelithiasis Post operative follow up    Plan:     Pathology report reviewed.  She is doing quite well. She may resume to normal activities.  Follow up as needed.

## 2013-06-03 ENCOUNTER — Other Ambulatory Visit: Payer: Self-pay

## 2013-07-30 ENCOUNTER — Other Ambulatory Visit: Payer: Self-pay | Admitting: Family Medicine

## 2013-07-30 NOTE — Telephone Encounter (Signed)
Med filled.  

## 2013-08-29 ENCOUNTER — Other Ambulatory Visit: Payer: Self-pay | Admitting: Family Medicine

## 2013-08-30 NOTE — Telephone Encounter (Signed)
Med filled.  

## 2013-09-11 ENCOUNTER — Other Ambulatory Visit: Payer: Self-pay | Admitting: Family Medicine

## 2013-09-11 NOTE — Telephone Encounter (Signed)
Med filled.  

## 2013-10-19 ENCOUNTER — Other Ambulatory Visit: Payer: Self-pay | Admitting: Family Medicine

## 2013-10-19 NOTE — Telephone Encounter (Signed)
Last ov 03-15-13  Med filled 08-31-12 #30 with 11

## 2013-11-13 ENCOUNTER — Other Ambulatory Visit: Payer: Self-pay | Admitting: Family Medicine

## 2013-11-15 ENCOUNTER — Encounter: Payer: Self-pay | Admitting: General Practice

## 2013-11-15 NOTE — Telephone Encounter (Signed)
Med filled.  

## 2014-01-20 IMAGING — US US ABDOMEN COMPLETE
1 series · 13 of 25 positions shown · non-contrast
Comparison: None

CLINICAL DATA: 32-year-old female with severe abdominal pain,
nausea and vomiting.

ABDOMINAL ULTRASOUND COMPLETE

[Series 1: us abdomen complete · 0.39mm/px · 13 of 82 slices shown]
[im 1/82]
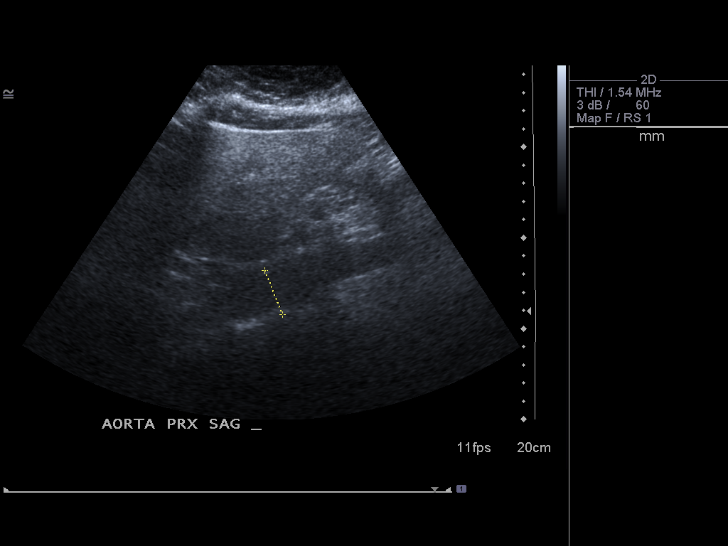
[im 7/82]
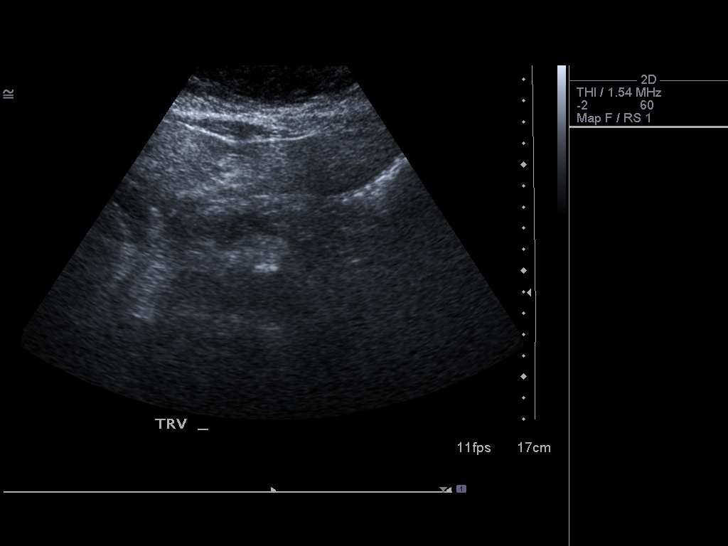
[im 14/82]
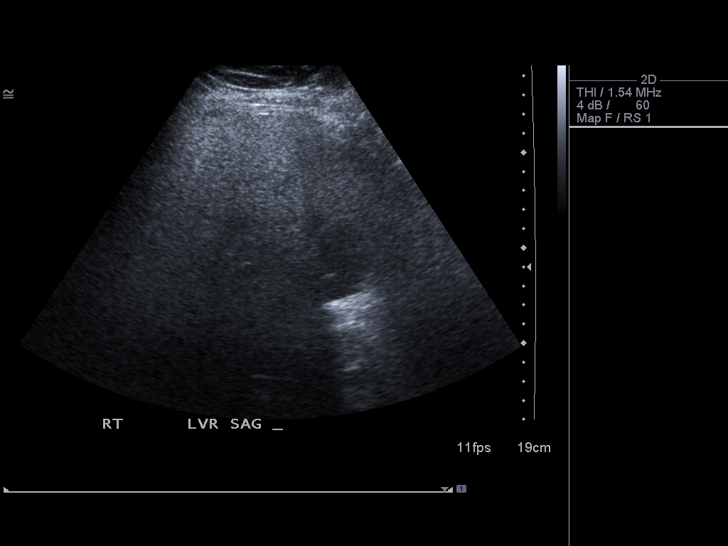
[im 21/82]
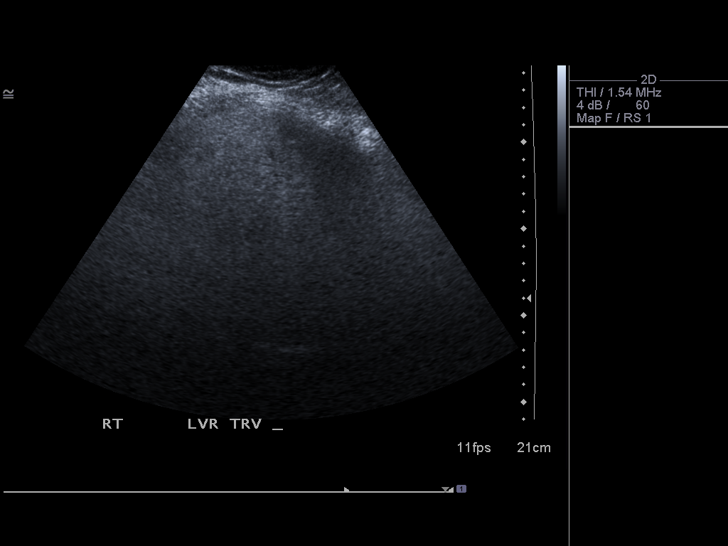
[im 28/82]
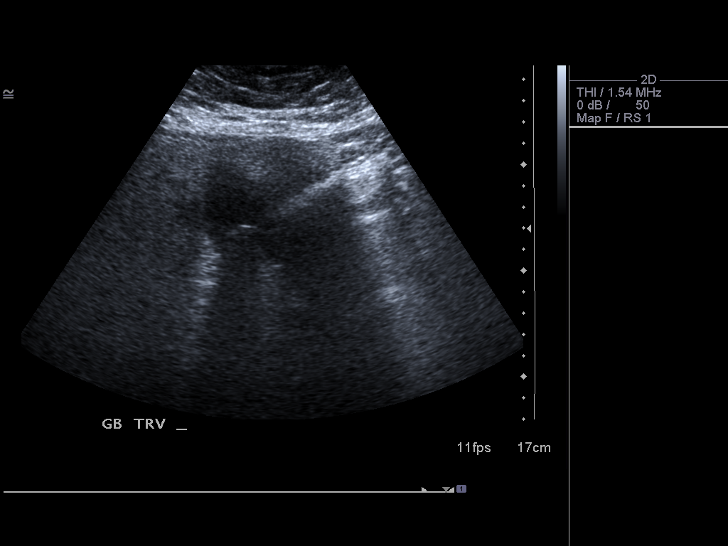
[im 34/82]
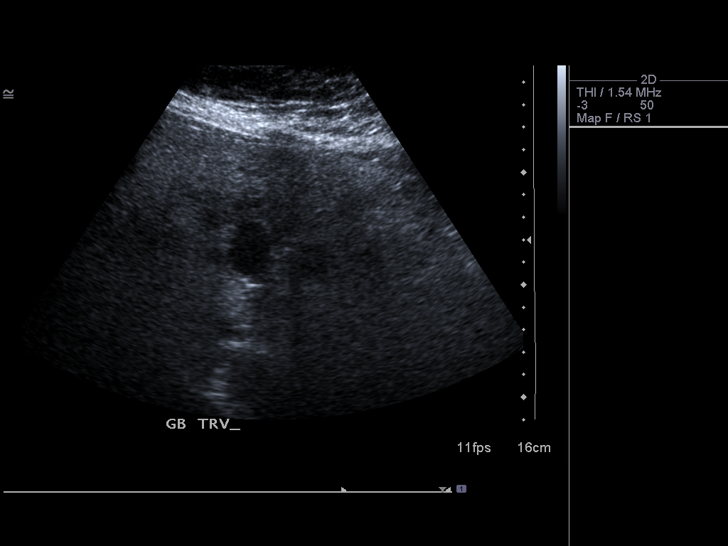
[im 41/82]
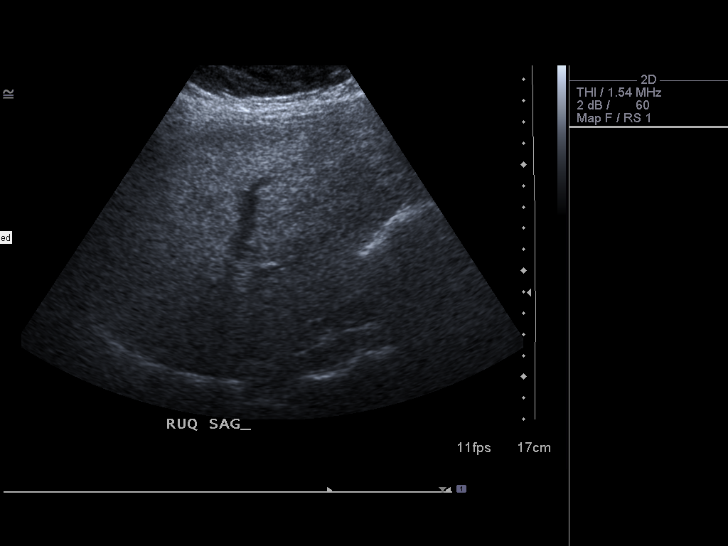
[im 48/82]
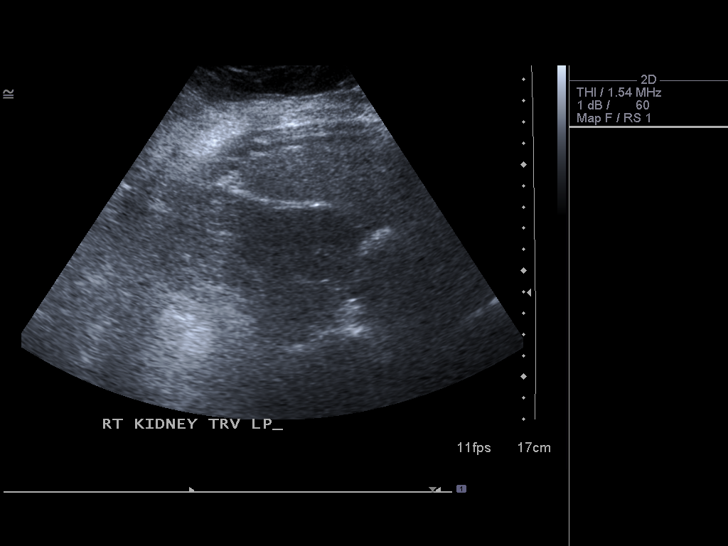
[im 55/82]
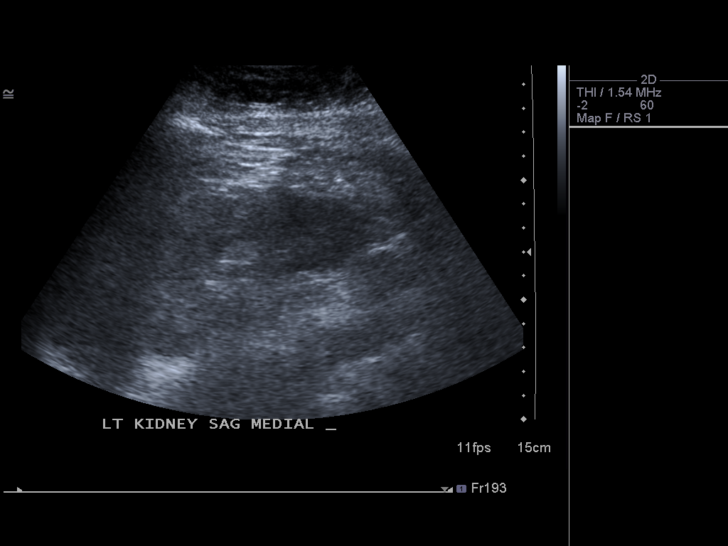
[im 61/82]
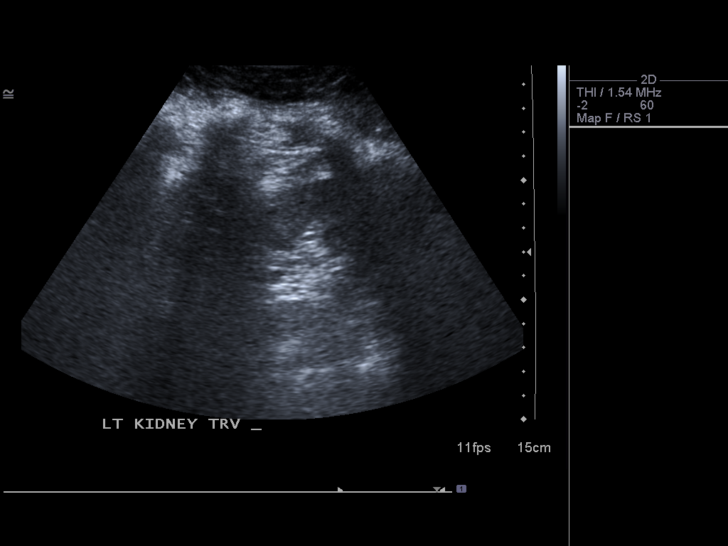
[im 68/82]
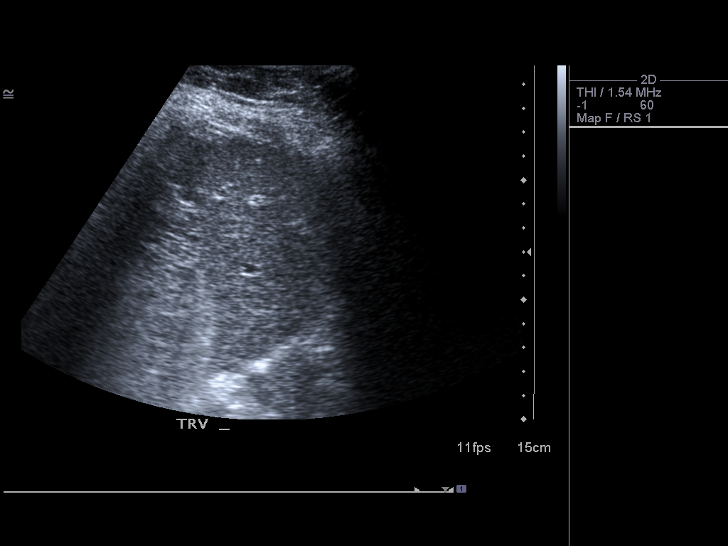
[im 75/82]
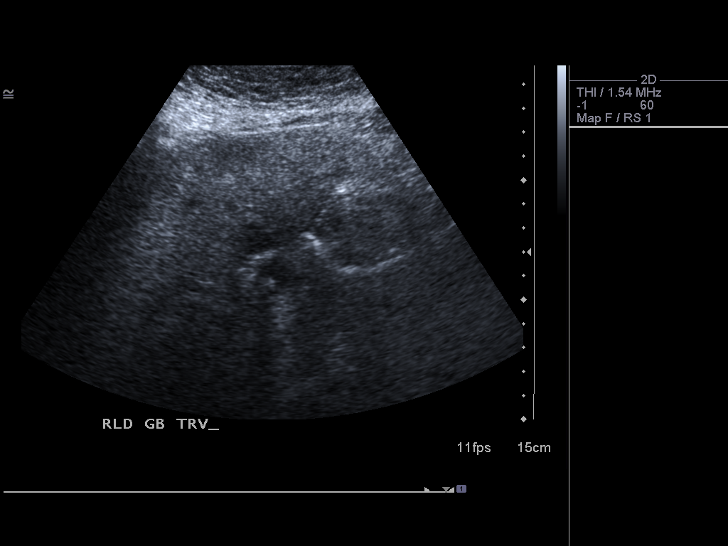
[im 82/82]
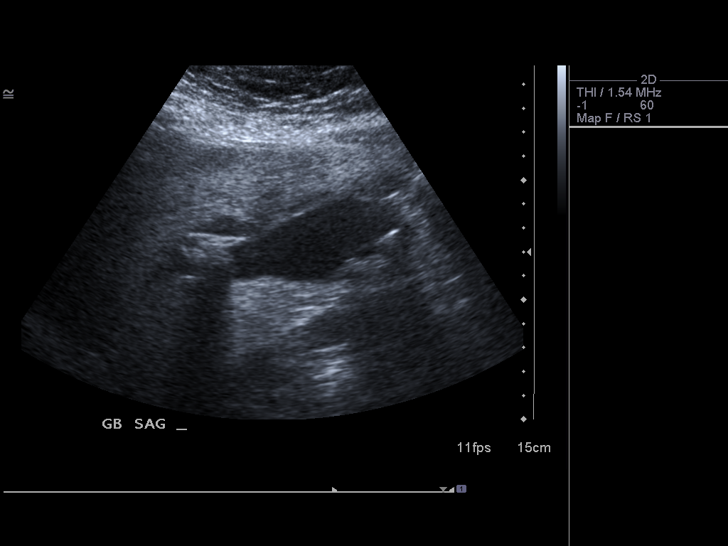

[13 of 25 positions shown; findings below may reference images not displayed]

FINDINGS: Gallbladder: Multiple mobile gallstones are identified.  A 6 mm
gallstone in the gallbladder neck does not appear to move.  There
is no evidence of gallbladder wall thickening.  An equivocal
sonographic Murphy's sign is noted.

Common Bile Duct:  There is no evidence of intrahepatic or
extrahepatic biliary dilation. The CBD measures 3.0 mm in greatest
diameter.

Liver: Diffusely increased echogenicity of the liver is compatible
with fatty infiltration.  No focal abnormalities are identified.

IVC:  Appears normal.

Pancreas:  Although the pancreas is difficult to visualize in its
entirety, no focal pancreatic abnormality is identified.

Spleen:  Within normal limits in size and echotexture.

Right kidney:  The right kidney is normal in size and parenchymal
echogenicity.  There is no evidence of solid mass, hydronephrosis
or definite renal calculi.  The right kidney measures 11.7 cm.

Left kidney:  The left kidney is normal in size and parenchymal
echogenicity.  There is no evidence of solid mass, hydronephrosis
or definite renal calculi.   The left kidney measures 11.7 cm.

Abdominal Aorta:  No abdominal aortic aneurysm identified.

There is no evidence of ascites.
IMPRESSION: Cholelithiasis with equivocal findings for acute cholecystitis.  6
mm gallstone appears lodged in the gallbladder neck.

No evidence of biliary dilatation.

Fatty infiltration of the liver.

## 2014-01-21 IMAGING — RF DG CHOLANGIOGRAM OPERATIVE
1 series · 6 of 6 positions shown · non-contrast
Comparison: None.

CLINICAL DATA: Cholecystitis

INTRAOPERATIVE CHOLANGIOGRAM
TECHNIQUE: Multiple fluoroscopic spot radiographs were obtained
during intraoperative cholangiogram and are submitted for
interpretation post-operatively.

[Series 1: run · 3 acquisitions, 6 frames shown]
[im 1/3]
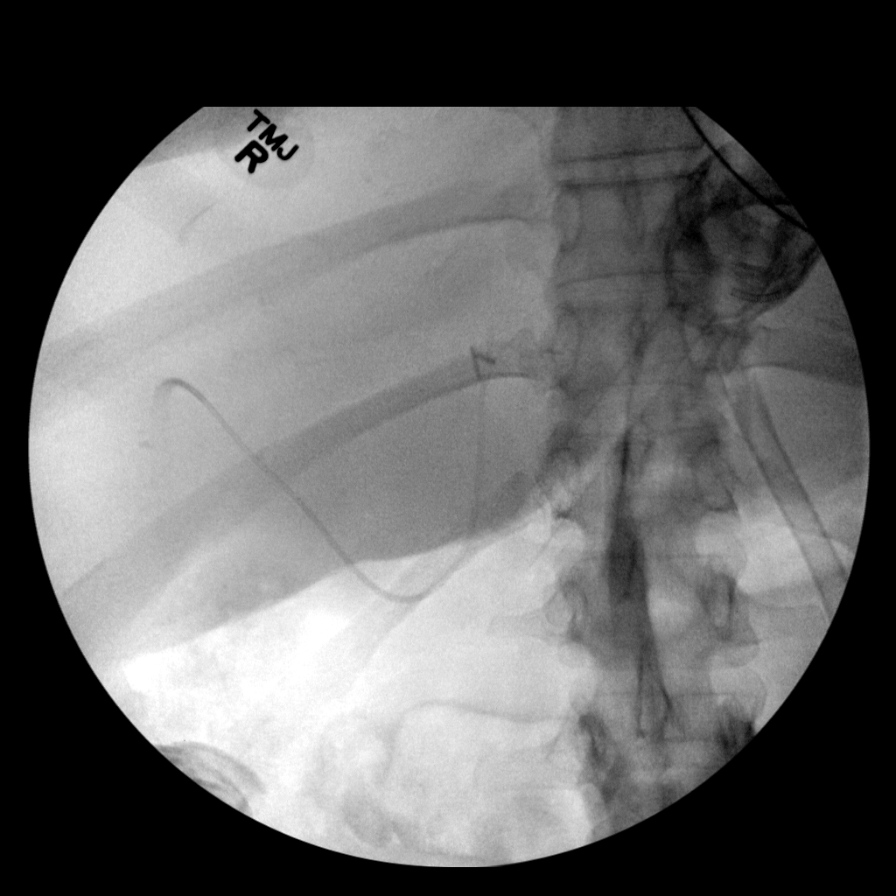
[im 1/3]
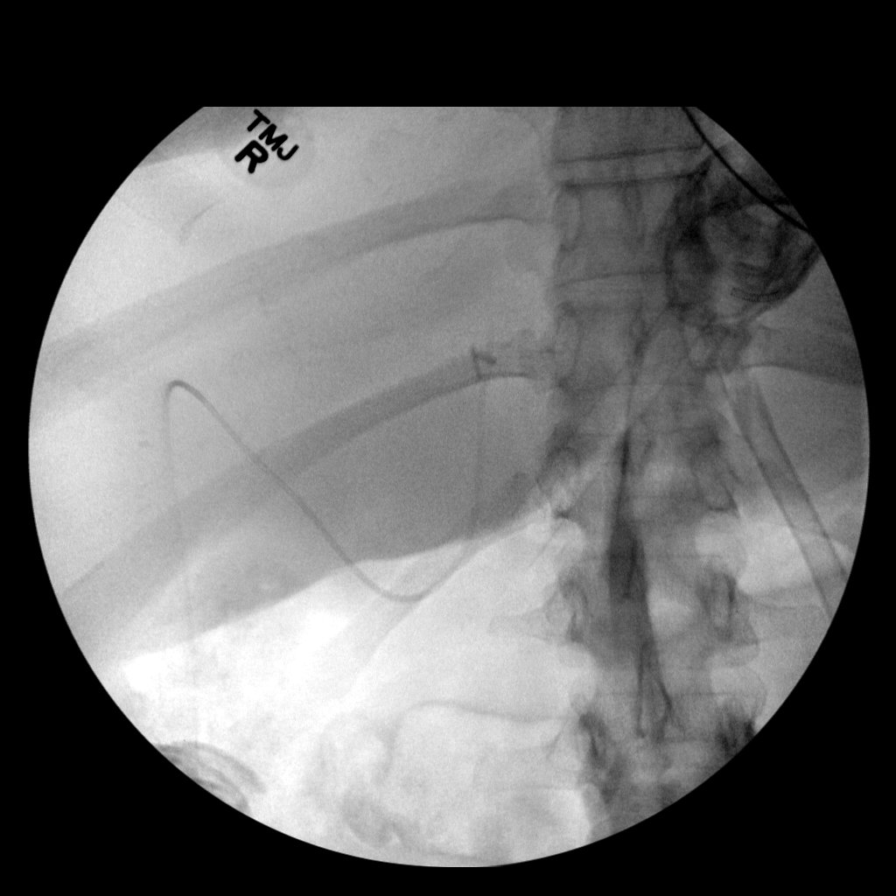
[im 1/3]
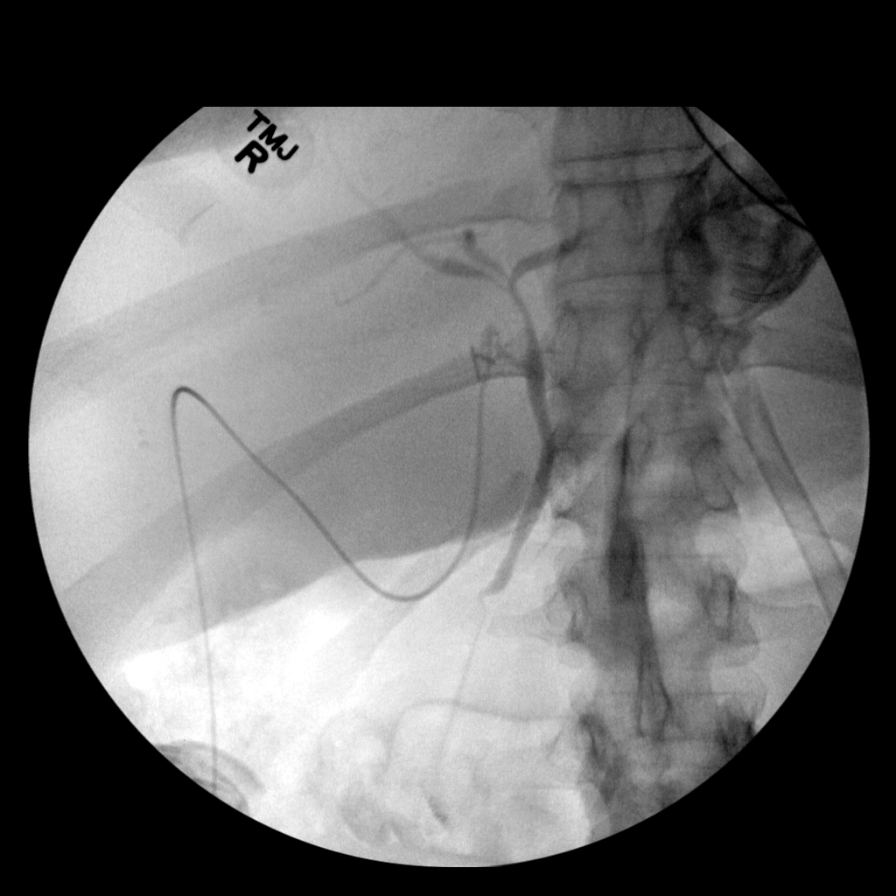
[im 1/3]
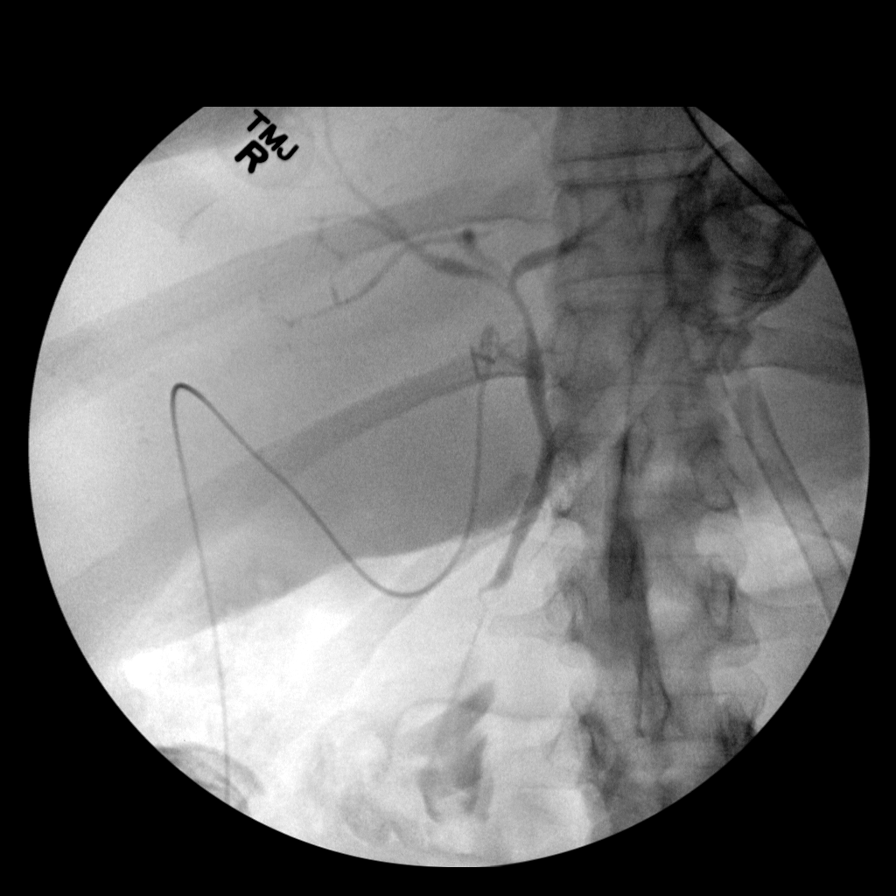
[im 2/3]
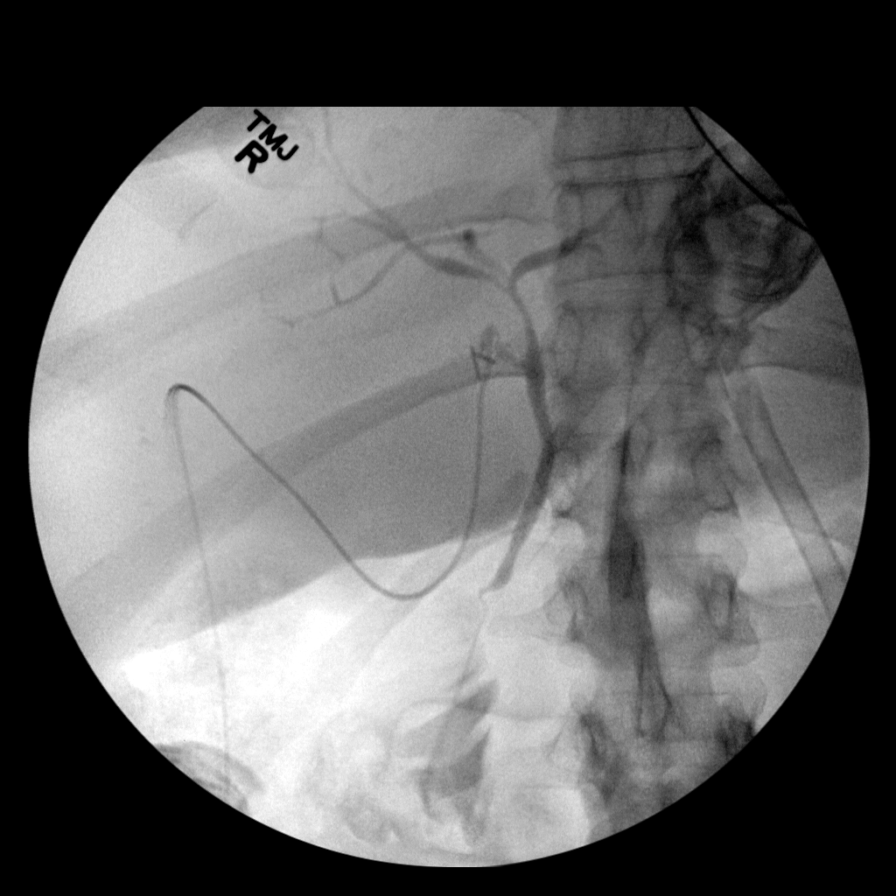
[im 3/3]
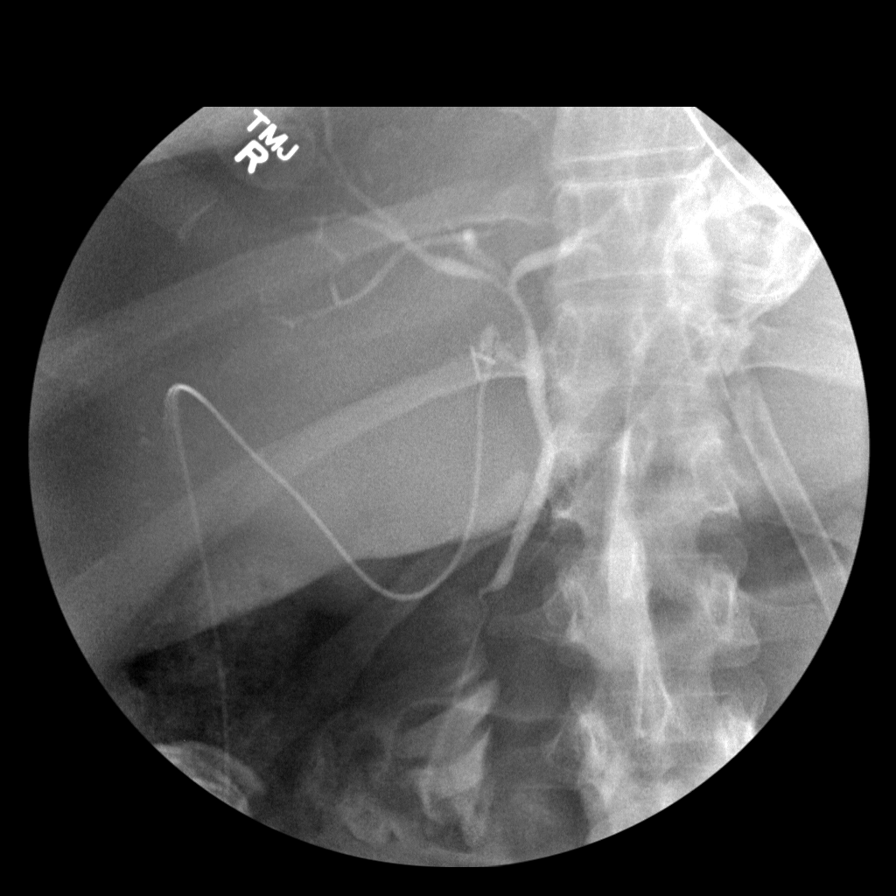

[6 of 6 positions shown; findings below may reference images not displayed]

FINDINGS: No persistent filling defects in the common duct.
Intrahepatic ducts are incompletely visualized, appearing
decompressed centrally. Contrast passes into the duodenum.

IMPRESSION

Negative for retained common duct stone.

## 2014-04-25 ENCOUNTER — Telehealth: Payer: Self-pay | Admitting: General Practice

## 2014-04-25 MED ORDER — CETIRIZINE HCL 10 MG PO TABS: ORAL_TABLET | ORAL | Status: DC

## 2014-04-25 NOTE — Telephone Encounter (Signed)
Med filled.  

## 2014-04-25 NOTE — Telephone Encounter (Signed)
Ok for #30 w/ 3 refills- please remind pt to schedule CPE

## 2014-04-25 NOTE — Telephone Encounter (Signed)
Last OV 03-15-13 (abd pain) Cetirizine last filled 09-11-13 # 30 with 4

## 2014-05-27 ENCOUNTER — Telehealth: Payer: Self-pay | Admitting: General Practice

## 2014-05-27 MED ORDER — SERTRALINE HCL 100 MG PO TABS: ORAL_TABLET | ORAL | Status: DC

## 2014-05-27 NOTE — Telephone Encounter (Signed)
Last OV 03-15-13 Med filled 08-31-12 #30 with 11  no upcoming appts, letter mailed on 11-15-13

## 2014-05-27 NOTE — Telephone Encounter (Signed)
Med filled. Letter mailed.  

## 2014-05-27 NOTE — Telephone Encounter (Signed)
Ok for #30, no additional refills w/o appt

## 2014-06-23 ENCOUNTER — Other Ambulatory Visit: Payer: Self-pay | Admitting: Family Medicine

## 2014-06-24 ENCOUNTER — Other Ambulatory Visit: Payer: Self-pay | Admitting: Family Medicine

## 2014-06-24 NOTE — Telephone Encounter (Signed)
Med denied. Pt needs an appt.  

## 2014-06-27 NOTE — Telephone Encounter (Signed)
Med denied. Pt has not been seen in over a year and letter was mailed 30 days ago to schedule an appt.

## 2014-07-28 ENCOUNTER — Other Ambulatory Visit: Payer: Self-pay | Admitting: General Practice

## 2014-07-28 NOTE — Telephone Encounter (Signed)
Last OV 03-15-13 (no upcoming appts,  2 letters mailed) loryna last filled 08-29-13 #28 with 11

## 2014-08-01 ENCOUNTER — Telehealth: Payer: Self-pay | Admitting: Family Medicine

## 2014-08-01 MED ORDER — SERTRALINE HCL 100 MG PO TABS: ORAL_TABLET | ORAL | Status: DC

## 2014-08-01 MED ORDER — DROSPIRENONE-ETHINYL ESTRADIOL 3-0.02 MG PO TABS: 1.0000 | ORAL_TABLET | Freq: Every day | ORAL | Status: DC

## 2014-08-01 NOTE — Telephone Encounter (Signed)
Caller name: Khaniya, Tenaglia Relation to pt: self  Call back number: [phone] Pharmacy:  [id number] - [place], [place] - [address] [phone] (Phone)     Reason for call:  Pt scheduled CPE for 09/16/2014 requesting a refill to hold her over until appointment sertraline (ZOLOFT) 100 MG tablet and LORYNA 3-0.02 MG tablet

## 2014-08-01 NOTE — Telephone Encounter (Signed)
Med filled.  

## 2014-09-16 ENCOUNTER — Ambulatory Visit (INDEPENDENT_AMBULATORY_CARE_PROVIDER_SITE_OTHER): Payer: BLUE CROSS/BLUE SHIELD | Admitting: Family Medicine

## 2014-09-16 ENCOUNTER — Encounter: Payer: Self-pay | Admitting: Family Medicine

## 2014-09-16 VITALS — BP 122/80 | HR 96 | Temp 98.2°F | Resp 16 | Ht 63.25 in | Wt 237.0 lb

## 2014-09-16 DIAGNOSIS — Z01419 Encounter for gynecological examination (general) (routine) without abnormal findings: Secondary | ICD-10-CM

## 2014-09-16 DIAGNOSIS — F32A Depression, unspecified: Secondary | ICD-10-CM

## 2014-09-16 DIAGNOSIS — F329 Major depressive disorder, single episode, unspecified: Secondary | ICD-10-CM

## 2014-09-16 DIAGNOSIS — Z23 Encounter for immunization: Secondary | ICD-10-CM

## 2014-09-16 LAB — CBC WITH DIFFERENTIAL/PLATELET
Basophils Absolute: 0.1 10*3/uL (ref 0.0–0.1)
Basophils Relative: 0.4 % (ref 0.0–3.0)
EOS PCT: 1.6 % (ref 0.0–5.0)
Eosinophils Absolute: 0.2 10*3/uL (ref 0.0–0.7)
HCT: 39.1 % (ref 36.0–46.0)
Hemoglobin: 13.2 g/dL (ref 12.0–15.0)
LYMPHS ABS: 4.5 10*3/uL — AB (ref 0.7–4.0)
Lymphocytes Relative: 35.3 % (ref 12.0–46.0)
MCHC: 33.8 g/dL (ref 30.0–36.0)
MCV: 87.5 fl (ref 78.0–100.0)
MONO ABS: 0.6 10*3/uL (ref 0.1–1.0)
Monocytes Relative: 4.6 % (ref 3.0–12.0)
NEUTROS PCT: 58.1 % (ref 43.0–77.0)
Neutro Abs: 7.5 10*3/uL (ref 1.4–7.7)
PLATELETS: 346 10*3/uL (ref 150.0–400.0)
RBC: 4.47 Mil/uL (ref 3.87–5.11)
RDW: 13.1 % (ref 11.5–15.5)
WBC: 12.9 10*3/uL — AB (ref 4.0–10.5)

## 2014-09-16 LAB — HEPATIC FUNCTION PANEL
ALBUMIN: 4.1 g/dL (ref 3.5–5.2)
ALT: 25 U/L (ref 0–35)
AST: 22 U/L (ref 0–37)
Alkaline Phosphatase: 59 U/L (ref 39–117)
BILIRUBIN DIRECT: 0 mg/dL (ref 0.0–0.3)
TOTAL PROTEIN: 7.7 g/dL (ref 6.0–8.3)
Total Bilirubin: 0.2 mg/dL (ref 0.2–1.2)

## 2014-09-16 LAB — LIPID PANEL
CHOL/HDL RATIO: 4
CHOLESTEROL: 195 mg/dL (ref 0–200)
HDL: 52.3 mg/dL (ref >39.00)
NONHDL: 142.7
TRIGLYCERIDES: 209 mg/dL — AB (ref 0.0–149.0)
VLDL: 41.8 mg/dL — ABNORMAL HIGH (ref 0.0–40.0)

## 2014-09-16 LAB — BASIC METABOLIC PANEL
BUN: 12 mg/dL (ref 6–23)
CALCIUM: 9.6 mg/dL (ref 8.4–10.5)
CO2: 28 mEq/L (ref 19–32)
CREATININE: 0.7 mg/dL (ref 0.40–1.20)
Chloride: 102 mEq/L (ref 96–112)
GFR: 101.28 mL/min (ref >60.00)
Glucose, Bld: 86 mg/dL (ref 70–99)
Potassium: 3.9 mEq/L (ref 3.5–5.1)
Sodium: 136 mEq/L (ref 135–145)

## 2014-09-16 LAB — VITAMIN D 25 HYDROXY (VIT D DEFICIENCY, FRACTURES): VITD: 17.76 ng/mL — ABNORMAL LOW (ref 30.00–100.00)

## 2014-09-16 LAB — TSH: TSH: 0.8 u[IU]/mL (ref 0.35–4.50)

## 2014-09-16 LAB — LDL CHOLESTEROL, DIRECT: Direct LDL: 117 mg/dL

## 2014-09-16 MED ORDER — SERTRALINE HCL 25 MG PO TABS: 25.0000 mg | ORAL_TABLET | Freq: Every day | ORAL | Status: DC

## 2014-09-16 NOTE — Progress Notes (Signed)
   Subjective:    Patient ID: Joan Atkinson, female    DOB: 09/26/1980, 34 y.o.   MRN: [id number]  HPI CPE- UTD on pap.  Pt is concerned about weight.  Wants to decrease Zoloft.   Review of Systems Patient reports no vision/ hearing changes, adenopathy,fever, weight change,  persistant/recurrent hoarseness , swallowing issues, chest pain, palpitations, edema, persistant/recurrent cough, hemoptysis, dyspnea (rest/exertional/paroxysmal nocturnal), gastrointestinal bleeding (melena, rectal bleeding), abdominal pain, significant heartburn, bowel changes, GU symptoms (dysuria, hematuria, incontinence), Gyn symptoms (abnormal  bleeding, pain),  syncope, focal weakness, memory loss, numbness & tingling, skin/hair/nail changes, abnormal bruising or bleeding, anxiety, or depression.     Objective:   Physical Exam  General Appearance:    Alert, cooperative, no distress, appears stated age, obese  Head:    Normocephalic, without obvious abnormality, atraumatic  Eyes:    PERRL, conjunctiva/corneas clear, EOM's intact, fundi    benign, both eyes  Ears:    Normal TM's and external ear canals, both ears  Nose:   Nares normal, septum midline, mucosa normal, no drainage    or sinus tenderness  Throat:   Lips, mucosa, and tongue normal; teeth and gums normal  Neck:   Supple, symmetrical, trachea midline, no adenopathy;    Thyroid: no enlargement/tenderness/nodules  Back:     Symmetric, no curvature, ROM normal, no CVA tenderness  Lungs:     Clear to auscultation bilaterally, respirations unlabored  Chest Wall:    No tenderness or deformity   Heart:    Regular rate and rhythm, S1 and S2 normal, no murmur, rub   or gallop  Breast Exam:    No tenderness, masses, or nipple abnormality  Abdomen:     Soft, non-tender, bowel sounds active all four quadrants,    no masses, no organomegaly  Genitalia:    deferred  Rectal:    Extremities:   Extremities normal, atraumatic, no cyanosis or edema  Pulses:   2+  and symmetric all extremities  Skin:   Skin color, texture, turgor normal, no rashes or lesions  Lymph nodes:   Cervical, supraclavicular, and axillary nodes normal  Neurologic:   CNII-XII intact, normal strength, sensation and reflexes    throughout          Assessment & Plan:

## 2014-09-16 NOTE — Progress Notes (Signed)
Pre visit review using our clinic review tool, if applicable. No additional management support is needed unless otherwise documented below in the visit note. 

## 2014-09-16 NOTE — Patient Instructions (Signed)
Follow up in 3-4 months to recheck weight loss progress We'll notify you of your lab results and make any changes if needed Try and make healthy food choices and get regular exercise Consider Weight Watchers or MyFitnessPal App to assist w/ weight loss Decrease Zoloft to 50mg  (1/2 tab) daily until your current pills are done Then start 25mg  daily (new prescription) x2-4 weeks and then decrease to 12.5mg  (1/2 tab) for at least 2 weeks and stop Call with any questions or concerns Happy Early [place] OuchBirthday!!!

## 2014-09-19 ENCOUNTER — Other Ambulatory Visit: Payer: Self-pay | Admitting: General Practice

## 2014-09-19 DIAGNOSIS — D72829 Elevated white blood cell count, unspecified: Secondary | ICD-10-CM

## 2014-09-19 MED ORDER — VITAMIN D (ERGOCALCIFEROL) 1.25 MG (50000 UNIT) PO CAPS: 50000.0000 [IU] | ORAL_CAPSULE | ORAL | Status: DC

## 2014-09-19 MED ORDER — DROSPIRENONE-ETHINYL ESTRADIOL 3-0.02 MG PO TABS: 1.0000 | ORAL_TABLET | Freq: Every day | ORAL | Status: DC

## 2014-09-20 DIAGNOSIS — F329 Major depressive disorder, single episode, unspecified: Secondary | ICD-10-CM | POA: Insufficient documentation

## 2014-09-20 DIAGNOSIS — F32A Depression, unspecified: Secondary | ICD-10-CM | POA: Insufficient documentation

## 2014-09-20 NOTE — Assessment & Plan Note (Signed)
Improved.  Pt would like to wean Zoloft.  Instructions given on how to do this.  Will follow.

## 2014-09-20 NOTE — Assessment & Plan Note (Signed)
Pt's PE WNL w/ exception of obesity.  UTD on pap smear.  Check labs.  Anticipatory guidance provided.

## 2014-09-23 ENCOUNTER — Other Ambulatory Visit: Payer: Self-pay | Admitting: Family Medicine

## 2014-09-23 NOTE — Telephone Encounter (Signed)
Med filled.  

## 2014-10-03 ENCOUNTER — Other Ambulatory Visit (INDEPENDENT_AMBULATORY_CARE_PROVIDER_SITE_OTHER): Payer: BLUE CROSS/BLUE SHIELD

## 2014-10-03 DIAGNOSIS — D72829 Elevated white blood cell count, unspecified: Secondary | ICD-10-CM

## 2014-10-03 LAB — CBC WITH DIFFERENTIAL/PLATELET
BASOS PCT: 0.7 % (ref 0.0–3.0)
Basophils Absolute: 0.1 10*3/uL (ref 0.0–0.1)
EOS ABS: 0.3 10*3/uL (ref 0.0–0.7)
Eosinophils Relative: 3.2 % (ref 0.0–5.0)
HEMATOCRIT: 37.4 % (ref 36.0–46.0)
HEMOGLOBIN: 12.9 g/dL (ref 12.0–15.0)
LYMPHS ABS: 2.5 10*3/uL (ref 0.7–4.0)
LYMPHS PCT: 32 % (ref 12.0–46.0)
MCHC: 34.5 g/dL (ref 30.0–36.0)
MCV: 87.6 fl (ref 78.0–100.0)
MONO ABS: 0.4 10*3/uL (ref 0.1–1.0)
Monocytes Relative: 4.8 % (ref 3.0–12.0)
NEUTROS ABS: 4.7 10*3/uL (ref 1.4–7.7)
Neutrophils Relative %: 59.3 % (ref 43.0–77.0)
Platelets: 288 10*3/uL (ref 150.0–400.0)
RBC: 4.27 Mil/uL (ref 3.87–5.11)
RDW: 13.1 % (ref 11.5–15.5)
WBC: 7.9 10*3/uL (ref 4.0–10.5)

## 2014-10-11 ENCOUNTER — Other Ambulatory Visit: Payer: Self-pay | Admitting: General Practice

## 2014-10-11 MED ORDER — CETIRIZINE HCL 10 MG PO TABS: ORAL_TABLET | ORAL | Status: DC

## 2014-11-14 ENCOUNTER — Other Ambulatory Visit: Payer: Self-pay | Admitting: Family Medicine

## 2014-11-14 NOTE — Telephone Encounter (Signed)
Med filled.  

## 2014-12-15 ENCOUNTER — Other Ambulatory Visit: Payer: Self-pay | Admitting: General Practice

## 2014-12-15 MED ORDER — CETIRIZINE HCL 10 MG PO TABS: ORAL_TABLET | ORAL | Status: DC

## 2014-12-19 ENCOUNTER — Other Ambulatory Visit: Payer: Self-pay | Admitting: General Practice

## 2014-12-19 MED ORDER — CETIRIZINE HCL 10 MG PO TABS: ORAL_TABLET | ORAL | Status: DC

## 2015-01-13 ENCOUNTER — Other Ambulatory Visit: Payer: Self-pay | Admitting: Family Medicine

## 2015-01-13 NOTE — Telephone Encounter (Signed)
Med filled.  

## 2015-06-29 ENCOUNTER — Other Ambulatory Visit: Payer: Self-pay | Admitting: Family Medicine

## 2015-06-29 NOTE — Telephone Encounter (Signed)
Medication filled to pharmacy as requested.   

## 2015-08-19 ENCOUNTER — Other Ambulatory Visit: Payer: Self-pay | Admitting: Family Medicine

## 2015-08-21 NOTE — Telephone Encounter (Signed)
Medication filled to pharmacy as requested.   

## 2015-09-20 ENCOUNTER — Ambulatory Visit (INDEPENDENT_AMBULATORY_CARE_PROVIDER_SITE_OTHER): Payer: BLUE CROSS/BLUE SHIELD | Admitting: Family Medicine

## 2015-09-20 ENCOUNTER — Encounter: Payer: Self-pay | Admitting: Family Medicine

## 2015-09-20 VITALS — BP 130/86 | HR 90 | Temp 98.4°F | Ht 63.0 in | Wt 229.8 lb

## 2015-09-20 DIAGNOSIS — F329 Major depressive disorder, single episode, unspecified: Secondary | ICD-10-CM | POA: Diagnosis not present

## 2015-09-20 DIAGNOSIS — E669 Obesity, unspecified: Secondary | ICD-10-CM | POA: Diagnosis not present

## 2015-09-20 DIAGNOSIS — F32A Depression, unspecified: Secondary | ICD-10-CM

## 2015-09-20 LAB — BASIC METABOLIC PANEL
BUN: 9 mg/dL (ref 6–23)
CO2: 26 meq/L (ref 19–32)
Calcium: 9.5 mg/dL (ref 8.4–10.5)
Chloride: 104 mEq/L (ref 96–112)
Creatinine, Ser: 0.65 mg/dL (ref 0.40–1.20)
GFR: 109.68 mL/min (ref >60.00)
GLUCOSE: 146 mg/dL — AB (ref 70–99)
POTASSIUM: 4.3 meq/L (ref 3.5–5.1)
SODIUM: 137 meq/L (ref 135–145)

## 2015-09-20 LAB — CBC WITH DIFFERENTIAL/PLATELET
BASOS ABS: 0.1 10*3/uL (ref 0.0–0.1)
Basophils Relative: 0.5 % (ref 0.0–3.0)
Eosinophils Absolute: 0.2 10*3/uL (ref 0.0–0.7)
Eosinophils Relative: 1.6 % (ref 0.0–5.0)
HCT: 40.6 % (ref 36.0–46.0)
Hemoglobin: 13.9 g/dL (ref 12.0–15.0)
LYMPHS ABS: 3.6 10*3/uL (ref 0.7–4.0)
Lymphocytes Relative: 34.1 % (ref 12.0–46.0)
MCHC: 34.2 g/dL (ref 30.0–36.0)
MCV: 88.2 fl (ref 78.0–100.0)
MONO ABS: 0.6 10*3/uL (ref 0.1–1.0)
MONOS PCT: 5.7 % (ref 3.0–12.0)
NEUTROS ABS: 6.1 10*3/uL (ref 1.4–7.7)
NEUTROS PCT: 58.1 % (ref 43.0–77.0)
PLATELETS: 355 10*3/uL (ref 150.0–400.0)
RBC: 4.6 Mil/uL (ref 3.87–5.11)
RDW: 12.3 % (ref 11.5–15.5)
WBC: 10.4 10*3/uL (ref 4.0–10.5)

## 2015-09-20 LAB — HEPATIC FUNCTION PANEL
ALBUMIN: 4.4 g/dL (ref 3.5–5.2)
ALK PHOS: 54 U/L (ref 39–117)
ALT: 18 U/L (ref 0–35)
AST: 19 U/L (ref 0–37)
Bilirubin, Direct: 0.1 mg/dL (ref 0.0–0.3)
Total Bilirubin: 0.3 mg/dL (ref 0.2–1.2)
Total Protein: 7.6 g/dL (ref 6.0–8.3)

## 2015-09-20 LAB — LIPID PANEL
Cholesterol: 203 mg/dL — ABNORMAL HIGH (ref 0–200)
HDL: 50.9 mg/dL (ref >39.00)
NonHDL: 152.35
Total CHOL/HDL Ratio: 4
Triglycerides: 218 mg/dL — ABNORMAL HIGH (ref 0.0–149.0)
VLDL: 43.6 mg/dL — ABNORMAL HIGH (ref 0.0–40.0)

## 2015-09-20 LAB — LDL CHOLESTEROL, DIRECT: Direct LDL: 126 mg/dL

## 2015-09-20 MED ORDER — SERTRALINE HCL 50 MG PO TABS: 50.0000 mg | ORAL_TABLET | Freq: Every day | ORAL | Status: DC

## 2015-09-20 NOTE — Assessment & Plan Note (Signed)
Ongoing issue.  Pt has lost some weight since last visit w/ increased physical activity.  Applauded her efforts.  Check labs to risk stratify.  Will follow.

## 2015-09-20 NOTE — Assessment & Plan Note (Signed)
Deteriorated since weaning off Zoloft.  Pt is now having worsening anxiety and will lie awake worrying about things she can't control.  She is interested in restarting Zoloft.  Will start at  x1 week and then increase to  daily.  Names and #s provided for counseling.  Will follow closely.

## 2015-09-20 NOTE — Progress Notes (Signed)
   Subjective:    Patient ID: Joan Atkinson, female    DOB: 12/07/1980, 35 y.o.   MRN: [id number]  HPI Obesity- ongoing issue, pt has lost weight.  Now walking 20-30 minutes daily.  Anxiety/depression- pt weaned off Zoloft.  Reports anxiety is worsening and impacting her sleep.  Will fall asleep fine but will wake early and lie awake and worry.  Pt reports increased work stress.  Pt is interested in seeing a therapist.  Review of Systems For ROS see HPI     Objective:   Physical Exam  Constitutional: She is oriented to person, place, and time. She appears well-developed and well-nourished. No distress.  HENT:  Head: Normocephalic and atraumatic.  Eyes: Conjunctivae and EOM are normal. Pupils are equal, round, and reactive to light.  Neck: Normal range of motion. Neck supple. No thyromegaly present.  Cardiovascular: Normal rate, regular rhythm, normal heart sounds and intact distal pulses.   No murmur heard. Pulmonary/Chest: Effort normal and breath sounds normal. No respiratory distress.  Musculoskeletal: She exhibits no edema.  Lymphadenopathy:    She has no cervical adenopathy.  Neurological: She is alert and oriented to person, place, and time.  Skin: Skin is warm and dry.  Psychiatric: She has a normal mood and affect. Her behavior is normal.  Vitals reviewed.         Assessment & Plan:

## 2015-09-20 NOTE — Progress Notes (Signed)
Pre visit review using our clinic review tool, if applicable. No additional management support is needed unless otherwise documented below in the visit note. 

## 2015-09-20 NOTE — Patient Instructions (Signed)
Follow up in 4-6 weeks to recheck anxiety Start the Zoloft, 1/2 tab x6 days and then increase to 1 tab daily Keep up the good work on healthy diet and regular exercise- you look great! Call and schedule w/ Camelia Eng or Raynelle Fanning at our office [phone] Call with any questions or concerns If you want to join [place] at the new [place] office, any scheduled appointments will automatically transfer and we will see you at [address] [place] [address] [place], [place], [place] [address] [facility] [place]) [place] in there!!!

## 2015-09-21 ENCOUNTER — Other Ambulatory Visit (INDEPENDENT_AMBULATORY_CARE_PROVIDER_SITE_OTHER): Payer: BLUE CROSS/BLUE SHIELD

## 2015-09-21 DIAGNOSIS — R7309 Other abnormal glucose: Secondary | ICD-10-CM

## 2015-09-21 LAB — TSH: TSH: 0.89 u[IU]/mL (ref 0.35–4.50)

## 2015-09-21 LAB — HEMOGLOBIN A1C: Hgb A1c MFr Bld: 5.6 % (ref 4.6–6.5)

## 2015-09-25 ENCOUNTER — Ambulatory Visit (INDEPENDENT_AMBULATORY_CARE_PROVIDER_SITE_OTHER): Payer: BLUE CROSS/BLUE SHIELD | Admitting: Psychology

## 2015-09-25 DIAGNOSIS — F4323 Adjustment disorder with mixed anxiety and depressed mood: Secondary | ICD-10-CM | POA: Diagnosis not present

## 2015-10-16 ENCOUNTER — Ambulatory Visit (INDEPENDENT_AMBULATORY_CARE_PROVIDER_SITE_OTHER): Payer: BLUE CROSS/BLUE SHIELD | Admitting: Psychology

## 2015-10-16 DIAGNOSIS — F4323 Adjustment disorder with mixed anxiety and depressed mood: Secondary | ICD-10-CM

## 2015-11-01 ENCOUNTER — Encounter: Payer: Self-pay | Admitting: Family Medicine

## 2015-11-01 ENCOUNTER — Ambulatory Visit (INDEPENDENT_AMBULATORY_CARE_PROVIDER_SITE_OTHER): Payer: BLUE CROSS/BLUE SHIELD | Admitting: Family Medicine

## 2015-11-01 VITALS — BP 120/86 | HR 97 | Temp 98.0°F | Resp 16 | Ht 63.0 in | Wt 230.0 lb

## 2015-11-01 DIAGNOSIS — F32A Depression, unspecified: Secondary | ICD-10-CM

## 2015-11-01 DIAGNOSIS — F329 Major depressive disorder, single episode, unspecified: Secondary | ICD-10-CM | POA: Diagnosis not present

## 2015-11-01 NOTE — Progress Notes (Signed)
   Subjective:    Patient ID: Joan Atkinson, female    DOB: 12/20/1980, 35 y.o.   MRN: [id number]  HPI Depression- pt reports feeling MUCH better since restarting the Zoloft.  Had 2 weeks of body aches due to medication but this has completely resolved.  Sleeping better.  Seeing Joan Atkinson which is also helping w/ mood.  Feeling less wound, less ready to pounce- able to sit still.   Review of Systems For ROS see HPI     Objective:   Physical Exam  Constitutional: She is oriented to person, place, and time. She appears well-developed and well-nourished. No distress.  HENT:  Head: Normocephalic and atraumatic.  Neurological: She is alert and oriented to person, place, and time.  Psychiatric: She has a normal mood and affect. Her behavior is normal. Thought content normal.  Vitals reviewed.         Assessment & Plan:

## 2015-11-01 NOTE — Progress Notes (Signed)
Pre visit review using our clinic review tool, if applicable. No additional management support is needed unless otherwise documented below in the visit note. 

## 2015-11-01 NOTE — Assessment & Plan Note (Signed)
Improved since restarting Zoloft.  Transient side effects have resolved.  Pt is not interested in adjusting dose at this time.  Will continue to follow.

## 2015-11-01 NOTE — Patient Instructions (Signed)
Schedule your complete physical at your convenience Keep up the good work!  You look great! Call with any questions or concerns Thanks for sticking with [place]! Happy [place] Birthday! Happy Spring!!!

## 2015-11-08 ENCOUNTER — Ambulatory Visit (INDEPENDENT_AMBULATORY_CARE_PROVIDER_SITE_OTHER): Payer: BLUE CROSS/BLUE SHIELD | Admitting: Psychology

## 2015-11-08 DIAGNOSIS — F4323 Adjustment disorder with mixed anxiety and depressed mood: Secondary | ICD-10-CM | POA: Diagnosis not present

## 2015-11-13 ENCOUNTER — Other Ambulatory Visit: Payer: Self-pay | Admitting: General Practice

## 2015-11-13 MED ORDER — DROSPIRENONE-ETHINYL ESTRADIOL 3-0.02 MG PO TABS: 1.0000 | ORAL_TABLET | Freq: Every day | ORAL | Status: DC

## 2015-12-13 ENCOUNTER — Ambulatory Visit (INDEPENDENT_AMBULATORY_CARE_PROVIDER_SITE_OTHER): Payer: BLUE CROSS/BLUE SHIELD | Admitting: Psychology

## 2015-12-13 DIAGNOSIS — F4323 Adjustment disorder with mixed anxiety and depressed mood: Secondary | ICD-10-CM

## 2015-12-28 ENCOUNTER — Encounter: Payer: Self-pay | Admitting: Family Medicine

## 2015-12-28 ENCOUNTER — Ambulatory Visit (INDEPENDENT_AMBULATORY_CARE_PROVIDER_SITE_OTHER): Payer: BLUE CROSS/BLUE SHIELD | Admitting: Family Medicine

## 2015-12-28 ENCOUNTER — Other Ambulatory Visit (HOSPITAL_COMMUNITY)
Admission: RE | Admit: 2015-12-28 | Discharge: 2015-12-28 | Disposition: A | Discharge status: Home / Self Care | Payer: BLUE CROSS/BLUE SHIELD | Source: Ambulatory Visit | Attending: Family Medicine | Admitting: Family Medicine

## 2015-12-28 VITALS — BP 121/83 | HR 82 | Temp 98.0°F | Resp 16 | Ht 63.0 in | Wt 230.5 lb

## 2015-12-28 DIAGNOSIS — Z1151 Encounter for screening for human papillomavirus (HPV): Secondary | ICD-10-CM | POA: Insufficient documentation

## 2015-12-28 DIAGNOSIS — Z01419 Encounter for gynecological examination (general) (routine) without abnormal findings: Secondary | ICD-10-CM

## 2015-12-28 DIAGNOSIS — Z0000 Encounter for general adult medical examination without abnormal findings: Secondary | ICD-10-CM | POA: Diagnosis not present

## 2015-12-28 DIAGNOSIS — Z124 Encounter for screening for malignant neoplasm of cervix: Secondary | ICD-10-CM

## 2015-12-28 LAB — CBC WITH DIFFERENTIAL/PLATELET
BASOS ABS: 0.1 10*3/uL (ref 0.0–0.1)
BASOS PCT: 0.7 % (ref 0.0–3.0)
EOS ABS: 0.2 10*3/uL (ref 0.0–0.7)
Eosinophils Relative: 2.3 % (ref 0.0–5.0)
HCT: 39.9 % (ref 36.0–46.0)
Hemoglobin: 13.2 g/dL (ref 12.0–15.0)
LYMPHS ABS: 3 10*3/uL (ref 0.7–4.0)
Lymphocytes Relative: 32.3 % (ref 12.0–46.0)
MCHC: 33.2 g/dL (ref 30.0–36.0)
MCV: 89.9 fl (ref 78.0–100.0)
MONO ABS: 0.5 10*3/uL (ref 0.1–1.0)
Monocytes Relative: 5.4 % (ref 3.0–12.0)
NEUTROS ABS: 5.4 10*3/uL (ref 1.4–7.7)
NEUTROS PCT: 59.3 % (ref 43.0–77.0)
PLATELETS: 321 10*3/uL (ref 150.0–400.0)
RBC: 4.44 Mil/uL (ref 3.87–5.11)
RDW: 12.7 % (ref 11.5–15.5)
WBC: 9.2 10*3/uL (ref 4.0–10.5)

## 2015-12-28 LAB — BASIC METABOLIC PANEL
BUN: 10 mg/dL (ref 6–23)
CALCIUM: 9.6 mg/dL (ref 8.4–10.5)
CHLORIDE: 104 meq/L (ref 96–112)
CO2: 25 meq/L (ref 19–32)
CREATININE: 0.65 mg/dL (ref 0.40–1.20)
GFR: 109.52 mL/min (ref >60.00)
GLUCOSE: 86 mg/dL (ref 70–99)
Potassium: 4.3 mEq/L (ref 3.5–5.1)
Sodium: 137 mEq/L (ref 135–145)

## 2015-12-28 LAB — HEPATIC FUNCTION PANEL
ALT: 17 U/L (ref 0–35)
AST: 19 U/L (ref 0–37)
Albumin: 4.4 g/dL (ref 3.5–5.2)
Alkaline Phosphatase: 56 U/L (ref 39–117)
BILIRUBIN TOTAL: 0.2 mg/dL (ref 0.2–1.2)
Bilirubin, Direct: 0 mg/dL (ref 0.0–0.3)
TOTAL PROTEIN: 7.5 g/dL (ref 6.0–8.3)

## 2015-12-28 LAB — TSH: TSH: 1.68 u[IU]/mL (ref 0.35–4.50)

## 2015-12-28 LAB — LIPID PANEL
CHOLESTEROL: 207 mg/dL — AB (ref 0–200)
HDL: 57.9 mg/dL (ref >39.00)
LDL Cholesterol: 115 mg/dL — ABNORMAL HIGH (ref 0–99)
NONHDL: 148.73
Total CHOL/HDL Ratio: 4
Triglycerides: 169 mg/dL — ABNORMAL HIGH (ref 0.0–149.0)
VLDL: 33.8 mg/dL (ref 0.0–40.0)

## 2015-12-28 LAB — VITAMIN D 25 HYDROXY (VIT D DEFICIENCY, FRACTURES): VITD: 19.63 ng/mL — AB (ref 30.00–100.00)

## 2015-12-28 NOTE — Assessment & Plan Note (Signed)
Pt's PE WNL w/ exception obesity.  Check labs.  Pap done today.  Encouraged healthy diet and regular exercise.  Anticipatory guidance provided.

## 2015-12-28 NOTE — Patient Instructions (Signed)
Follow up in 1 year or as needed We'll notify you of your lab results and make any changes if needed Continue to work on healthy diet and regular exercise- you look great! Call with any questions or concerns Thanks for sticking with us! Have a great summer!!

## 2015-12-28 NOTE — Progress Notes (Signed)
Pre visit review using our clinic review tool, if applicable. No additional management support is needed unless otherwise documented below in the visit note. 

## 2015-12-28 NOTE — Progress Notes (Signed)
   Subjective:    Patient ID: Armanda Magicerina M Monroy, female    DOB: 06/22/1981, 35 y.o.   MRN: [id number]  HPI CPE- due for pap today.  No concerns today.   Review of Systems Patient reports no vision/ hearing changes, adenopathy,fever, weight change,  persistant/recurrent hoarseness , swallowing issues, chest pain, palpitations, edema, persistant/recurrent cough, hemoptysis, dyspnea (rest/exertional/paroxysmal nocturnal), gastrointestinal bleeding (melena, rectal bleeding), abdominal pain, significant heartburn, bowel changes, GU symptoms (dysuria, hematuria, incontinence), Gyn symptoms (abnormal  bleeding, pain),  syncope, focal weakness, memory loss, numbness & tingling, skin/hair/nail changes, abnormal bruising or bleeding, anxiety, or depression.     Objective:   Physical Exam  General Appearance:    Alert, cooperative, no distress, appears stated age, obese  Head:    Normocephalic, without obvious abnormality, atraumatic  Eyes:    PERRL, conjunctiva/corneas clear, EOM's intact, fundi    benign, both eyes  Ears:    Normal TM's and external ear canals, both ears  Nose:   Nares normal, septum midline, mucosa normal, no drainage    or sinus tenderness  Throat:   Lips, mucosa, and tongue normal; teeth and gums normal  Neck:   Supple, symmetrical, trachea midline, no adenopathy;    Thyroid: no enlargement/tenderness/nodules  Back:     Symmetric, no curvature, ROM normal, no CVA tenderness  Lungs:     Clear to auscultation bilaterally, respirations unlabored  Chest Wall:    No tenderness or deformity   Heart:    Regular rate and rhythm, S1 and S2 normal, no murmur, rub   or gallop  Breast Exam:    No tenderness, masses, or nipple abnormality  Abdomen:     Soft, non-tender, bowel sounds active all four quadrants,    no masses, no organomegaly  Genitalia:    External genitalia normal, cervix normal in appearance, no CMT, uterus in normal size and position, adnexa w/out mass or tenderness,  mucosa pink and moist, no lesions or discharge present  Rectal:    Normal external appearance  Extremities:   Extremities normal, atraumatic, no cyanosis or edema  Pulses:   2+ and symmetric all extremities  Skin:   Skin color, texture, turgor normal, no rashes or lesions  Lymph nodes:   Cervical, supraclavicular, and axillary nodes normal  Neurologic:   CNII-XII intact, normal strength, sensation and reflexes    throughout          Assessment & Plan:

## 2015-12-28 NOTE — Assessment & Plan Note (Signed)
Pap collected. 

## 2015-12-29 ENCOUNTER — Other Ambulatory Visit: Payer: Self-pay | Admitting: General Practice

## 2015-12-29 LAB — CYTOLOGY - PAP

## 2015-12-29 MED ORDER — VITAMIN D (ERGOCALCIFEROL) 1.25 MG (50000 UNIT) PO CAPS: 50000.0000 [IU] | ORAL_CAPSULE | ORAL | Status: DC

## 2016-01-08 ENCOUNTER — Other Ambulatory Visit: Payer: Self-pay | Admitting: General Practice

## 2016-01-08 MED ORDER — SERTRALINE HCL 50 MG PO TABS: 50.0000 mg | ORAL_TABLET | Freq: Every day | ORAL | Status: DC

## 2016-01-09 ENCOUNTER — Encounter: Payer: Self-pay | Admitting: Family Medicine

## 2016-01-22 ENCOUNTER — Ambulatory Visit (INDEPENDENT_AMBULATORY_CARE_PROVIDER_SITE_OTHER): Payer: BLUE CROSS/BLUE SHIELD | Admitting: Psychology

## 2016-01-22 DIAGNOSIS — F4323 Adjustment disorder with mixed anxiety and depressed mood: Secondary | ICD-10-CM | POA: Diagnosis not present

## 2016-02-08 ENCOUNTER — Other Ambulatory Visit: Payer: Self-pay | Admitting: Family Medicine

## 2016-02-08 NOTE — Telephone Encounter (Signed)
Medication filled to pharmacy as requested.   

## 2016-03-25 ENCOUNTER — Other Ambulatory Visit: Payer: Self-pay | Admitting: Family Medicine

## 2016-04-29 ENCOUNTER — Other Ambulatory Visit: Payer: Self-pay | Admitting: General Practice

## 2016-04-29 MED ORDER — SERTRALINE HCL 50 MG PO TABS: 50.0000 mg | ORAL_TABLET | Freq: Every day | ORAL | 0 refills | Status: DC

## 2016-05-06 ENCOUNTER — Encounter: Payer: Self-pay | Admitting: Family Medicine

## 2016-05-06 ENCOUNTER — Ambulatory Visit (INDEPENDENT_AMBULATORY_CARE_PROVIDER_SITE_OTHER): Payer: BLUE CROSS/BLUE SHIELD | Admitting: Family Medicine

## 2016-05-06 VITALS — BP 130/82 | HR 80 | Temp 98.4°F | Resp 16 | Ht 63.0 in | Wt 236.2 lb

## 2016-05-06 DIAGNOSIS — J0100 Acute maxillary sinusitis, unspecified: Secondary | ICD-10-CM | POA: Diagnosis not present

## 2016-05-06 MED ORDER — GUAIFENESIN-CODEINE 100-10 MG/5ML PO SYRP: 10.0000 mL | ORAL_SOLUTION | Freq: Three times a day (TID) | ORAL | 0 refills | Status: DC | PRN

## 2016-05-06 MED ORDER — AMOXICILLIN 875 MG PO TABS: 875.0000 mg | ORAL_TABLET | Freq: Two times a day (BID) | ORAL | 0 refills | Status: DC

## 2016-05-06 NOTE — Patient Instructions (Signed)
Follow up as needed Start the Amoxicillin twice daily- take w/ food Drink plenty of fluids REST! Mucinex DM for daytime cough (will not cause drowsiness) Codeine cough syrup for nights/weekends- will cause drowsiness Call with any questions or concerns Hang in there!!!

## 2016-05-06 NOTE — Progress Notes (Signed)
Pre visit review using our clinic review tool, if applicable. No additional management support is needed unless otherwise documented below in the visit note. 

## 2016-05-06 NOTE — Progress Notes (Signed)
   Subjective:    Patient ID: Joan Atkinson, female    DOB: 11/19/1980, 35 y.o.   MRN: [id number]  HPI URI- sxs started ~10 days ago w/ nasal congestion.  No fevers.  No N/V.  + cough- mostly dry, denies chest tightness.  + maxillary sinus pain, HA.  + tooth pain.  + sick contacts.  Bilateral ear pain/fullness   Review of Systems For ROS see HPI     Objective:   Physical Exam  Constitutional: She appears well-developed and well-nourished. No distress.  HENT:  Head: Normocephalic and atraumatic.  Right Ear: Tympanic membrane normal.  Left Ear: Tympanic membrane normal.  Nose: Mucosal edema and rhinorrhea present. Right sinus exhibits maxillary sinus tenderness and frontal sinus tenderness. Left sinus exhibits maxillary sinus tenderness and frontal sinus tenderness.  Mouth/Throat: Uvula is midline and mucous membranes are normal. Posterior oropharyngeal erythema present. No oropharyngeal exudate.  Eyes: Conjunctivae and EOM are normal. Pupils are equal, round, and reactive to light.  Neck: Normal range of motion. Neck supple.  Cardiovascular: Normal rate, regular rhythm and normal heart sounds.   Pulmonary/Chest: Effort normal and breath sounds normal. No respiratory distress. She has no wheezes.  Lymphadenopathy:    She has no cervical adenopathy.  Vitals reviewed.         Assessment & Plan:  Maxillary sinusitis- new.  Pt's sxs and PE consistent w/ infxn.  Start Amox.  Cheratussin PRN cough.  Reviewed supportive care and red flags that should prompt return.  Pt expressed understanding and is in agreement w/ plan.

## 2016-05-14 ENCOUNTER — Telehealth: Payer: Self-pay | Admitting: Family Medicine

## 2016-05-14 NOTE — Telephone Encounter (Signed)
Patient calling to report she was started on amoxicillin last Monday and she now has a yeast infection.  Patient is requesting to have a rx sent in for the yeast infection. Pharmacy:   [id number] - [place], [place] - [address] [phone] (Phone) [phone] (Fax)

## 2016-05-15 MED ORDER — FLUCONAZOLE 150 MG PO TABS
150.0000 mg | ORAL_TABLET | Freq: Once | ORAL | 0 refills | Status: AC
Start: 2016-05-15 — End: 2016-05-15

## 2016-05-15 NOTE — Telephone Encounter (Signed)
Ok for Diflucan 150mg x1 dose 

## 2016-05-15 NOTE — Telephone Encounter (Signed)
Called pt and informed that med was filled at pharmacy.

## 2016-05-25 ENCOUNTER — Other Ambulatory Visit: Payer: Self-pay | Admitting: Family Medicine

## 2016-05-28 ENCOUNTER — Other Ambulatory Visit: Payer: Self-pay | Admitting: Family Medicine

## 2016-07-26 ENCOUNTER — Other Ambulatory Visit: Payer: Self-pay | Admitting: Family Medicine

## 2016-10-23 ENCOUNTER — Other Ambulatory Visit: Payer: Self-pay | Admitting: Family Medicine

## 2016-10-26 ENCOUNTER — Other Ambulatory Visit: Payer: Self-pay | Admitting: Family Medicine

## 2016-11-21 ENCOUNTER — Other Ambulatory Visit: Payer: Self-pay | Admitting: Family Medicine

## 2017-01-29 ENCOUNTER — Other Ambulatory Visit: Payer: Self-pay | Admitting: Family Medicine

## 2017-01-30 ENCOUNTER — Encounter: Payer: Self-pay | Admitting: General Practice

## 2017-02-14 ENCOUNTER — Encounter: Payer: Self-pay | Admitting: Family Medicine

## 2017-02-14 ENCOUNTER — Encounter: Payer: Self-pay | Admitting: General Practice

## 2017-02-14 ENCOUNTER — Ambulatory Visit (INDEPENDENT_AMBULATORY_CARE_PROVIDER_SITE_OTHER): Payer: BLUE CROSS/BLUE SHIELD | Admitting: Family Medicine

## 2017-02-14 VITALS — BP 124/80 | HR 78 | Temp 98.0°F | Resp 16 | Ht 63.0 in | Wt 227.4 lb

## 2017-02-14 DIAGNOSIS — E559 Vitamin D deficiency, unspecified: Secondary | ICD-10-CM | POA: Diagnosis not present

## 2017-02-14 DIAGNOSIS — Z6841 Body Mass Index (BMI) 40.0 and over, adult: Secondary | ICD-10-CM | POA: Diagnosis not present

## 2017-02-14 DIAGNOSIS — Z0000 Encounter for general adult medical examination without abnormal findings: Secondary | ICD-10-CM | POA: Diagnosis not present

## 2017-02-14 LAB — CBC WITH DIFFERENTIAL/PLATELET
BASOS ABS: 0.1 10*3/uL (ref 0.0–0.1)
Basophils Relative: 0.9 % (ref 0.0–3.0)
EOS PCT: 2 % (ref 0.0–5.0)
Eosinophils Absolute: 0.2 10*3/uL (ref 0.0–0.7)
HCT: 39.8 % (ref 36.0–46.0)
HEMOGLOBIN: 13.2 g/dL (ref 12.0–15.0)
Lymphocytes Relative: 32 % (ref 12.0–46.0)
Lymphs Abs: 3.1 10*3/uL (ref 0.7–4.0)
MCHC: 33.1 g/dL (ref 30.0–36.0)
MCV: 92.1 fl (ref 78.0–100.0)
MONO ABS: 0.5 10*3/uL (ref 0.1–1.0)
MONOS PCT: 5.6 % (ref 3.0–12.0)
Neutro Abs: 5.7 10*3/uL (ref 1.4–7.7)
Neutrophils Relative %: 59.5 % (ref 43.0–77.0)
Platelets: 344 10*3/uL (ref 150.0–400.0)
RBC: 4.32 Mil/uL (ref 3.87–5.11)
RDW: 12.9 % (ref 11.5–15.5)
WBC: 9.6 10*3/uL (ref 4.0–10.5)

## 2017-02-14 LAB — BASIC METABOLIC PANEL
BUN: 7 mg/dL (ref 6–23)
CHLORIDE: 103 meq/L (ref 96–112)
CO2: 26 meq/L (ref 19–32)
Calcium: 9.9 mg/dL (ref 8.4–10.5)
Creatinine, Ser: 0.66 mg/dL (ref 0.40–1.20)
GFR: 106.93 mL/min (ref >60.00)
GLUCOSE: 81 mg/dL (ref 70–99)
POTASSIUM: 4.1 meq/L (ref 3.5–5.1)
SODIUM: 138 meq/L (ref 135–145)

## 2017-02-14 LAB — LIPID PANEL
CHOLESTEROL: 180 mg/dL (ref 0–200)
HDL: 51.7 mg/dL (ref >39.00)
LDL CALC: 95 mg/dL (ref 0–99)
NonHDL: 127.89
TRIGLYCERIDES: 165 mg/dL — AB (ref 0.0–149.0)
Total CHOL/HDL Ratio: 3
VLDL: 33 mg/dL (ref 0.0–40.0)

## 2017-02-14 LAB — HEPATIC FUNCTION PANEL
ALBUMIN: 4.4 g/dL (ref 3.5–5.2)
ALT: 25 U/L (ref 0–35)
AST: 18 U/L (ref 0–37)
Alkaline Phosphatase: 62 U/L (ref 39–117)
Bilirubin, Direct: 0 mg/dL (ref 0.0–0.3)
TOTAL PROTEIN: 7.4 g/dL (ref 6.0–8.3)
Total Bilirubin: 0.3 mg/dL (ref 0.2–1.2)

## 2017-02-14 LAB — TSH: TSH: 1.08 u[IU]/mL (ref 0.35–4.50)

## 2017-02-14 LAB — VITAMIN D 25 HYDROXY (VIT D DEFICIENCY, FRACTURES): VITD: 34.74 ng/mL (ref 30.00–100.00)

## 2017-02-14 NOTE — Progress Notes (Signed)
   Subjective:    Patient ID: Joan Atkinson, female    DOB: 09/16/1980, 36 y.o.   MRN: [id number]  HPI CPE- UTD on pap, immunizations.  No concerns today.  She has lost 9 lbs since last visit.     Review of Systems Patient reports no vision/ hearing changes, adenopathy,fever, weight change,  persistant/recurrent hoarseness , swallowing issues, chest pain, palpitations, edema, persistant/recurrent cough, hemoptysis, dyspnea (rest/exertional/paroxysmal nocturnal), gastrointestinal bleeding (melena, rectal bleeding), abdominal pain, significant heartburn, bowel changes, GU symptoms (dysuria, hematuria, incontinence), Gyn symptoms (abnormal  bleeding, pain),  syncope, focal weakness, memory loss, numbness & tingling, skin/hair/nail changes, abnormal bruising or bleeding, anxiety, or depression.     Objective:   Physical Exam  General Appearance:    Alert, cooperative, no distress, appears stated age  Head:    Normocephalic, without obvious abnormality, atraumatic  Eyes:    PERRL, conjunctiva/corneas clear, EOM's intact, fundi    benign, both eyes  Ears:    Normal TM's and external ear canals, both ears  Nose:   Nares normal, septum midline, mucosa normal, no drainage    or sinus tenderness  Throat:   Lips, mucosa, and tongue normal; teeth and gums normal  Neck:   Supple, symmetrical, trachea midline, no adenopathy;    Thyroid: no enlargement/tenderness/nodules  Back:     Symmetric, no curvature, ROM normal, no CVA tenderness  Lungs:     Clear to auscultation bilaterally, respirations unlabored  Chest Wall:    No tenderness or deformity   Heart:    Regular rate and rhythm, S1 and S2 normal, no murmur, rub   or gallop  Breast Exam:    No tenderness, masses, or nipple abnormality  Abdomen:     Soft, non-tender, bowel sounds active all four quadrants,    no masses, no organomegaly  Genitalia:    deferred  Rectal:    Extremities:   Extremities normal, atraumatic, no cyanosis or edema    Pulses:   2+ and symmetric all extremities  Skin:   Skin color, texture, turgor normal, no rashes or lesions  Lymph nodes:   Cervical, supraclavicular, and axillary nodes normal  Neurologic:   CNII-XII intact, normal strength, sensation and reflexes    throughout          Assessment & Plan:

## 2017-02-14 NOTE — Assessment & Plan Note (Signed)
Pt has hx of this.  Check labs.  Replete prn. 

## 2017-02-14 NOTE — Assessment & Plan Note (Signed)
Pt lost 9 lbs since last visit.  Applauded her efforts.  Check labs to risk stratify.  Will follow.

## 2017-02-14 NOTE — Progress Notes (Signed)
Pre visit review using our clinic review tool, if applicable. No additional management support is needed unless otherwise documented below in the visit note. 

## 2017-02-14 NOTE — Assessment & Plan Note (Signed)
Pt's PE WNL w/ exception of obesity.  UTD on pap, immunizations.  Check labs.  Anticipatory guidance provided.  

## 2017-02-14 NOTE — Patient Instructions (Signed)
Follow up in 1 year or as needed We'll notify you of your lab results and make any changes if needed Keep up the good work on healthy diet and regular exercise- you're doing great!! Call with any questions or concerns Have a great summer!!! 

## 2017-04-05 ENCOUNTER — Other Ambulatory Visit: Payer: Self-pay | Admitting: Family Medicine

## 2017-04-28 ENCOUNTER — Other Ambulatory Visit: Payer: Self-pay | Admitting: Family Medicine

## 2017-05-06 ENCOUNTER — Other Ambulatory Visit: Payer: Self-pay | Admitting: Family Medicine

## 2017-05-09 ENCOUNTER — Encounter: Payer: Self-pay | Admitting: Family Medicine

## 2017-07-18 ENCOUNTER — Other Ambulatory Visit: Payer: Self-pay | Admitting: General Practice

## 2017-07-18 MED ORDER — DROSPIRENONE-ETHINYL ESTRADIOL 3-0.02 MG PO TABS: 1.0000 | ORAL_TABLET | Freq: Every day | ORAL | 0 refills | Status: DC

## 2017-08-05 ENCOUNTER — Other Ambulatory Visit: Payer: Self-pay | Admitting: General Practice

## 2017-08-05 MED ORDER — SERTRALINE HCL 50 MG PO TABS: 50.0000 mg | ORAL_TABLET | Freq: Every day | ORAL | 0 refills | Status: DC

## 2017-08-18 ENCOUNTER — Encounter: Payer: Self-pay | Admitting: Family Medicine

## 2017-10-08 ENCOUNTER — Other Ambulatory Visit: Payer: Self-pay | Admitting: Family Medicine

## 2017-10-27 ENCOUNTER — Encounter: Payer: Self-pay | Admitting: Family Medicine

## 2017-11-10 ENCOUNTER — Other Ambulatory Visit: Payer: Self-pay | Admitting: Family Medicine

## 2017-12-05 ENCOUNTER — Other Ambulatory Visit: Payer: Self-pay | Admitting: Family Medicine

## 2017-12-05 ENCOUNTER — Other Ambulatory Visit: Payer: Self-pay

## 2017-12-05 ENCOUNTER — Encounter: Payer: Self-pay | Admitting: Family Medicine

## 2017-12-05 ENCOUNTER — Ambulatory Visit: Payer: BLUE CROSS/BLUE SHIELD | Admitting: Family Medicine

## 2017-12-05 VITALS — BP 123/82 | HR 81 | Temp 98.1°F | Resp 16 | Ht 63.0 in | Wt 235.0 lb

## 2017-12-05 DIAGNOSIS — Z6841 Body Mass Index (BMI) 40.0 and over, adult: Secondary | ICD-10-CM | POA: Diagnosis not present

## 2017-12-05 DIAGNOSIS — F329 Major depressive disorder, single episode, unspecified: Secondary | ICD-10-CM | POA: Diagnosis not present

## 2017-12-05 DIAGNOSIS — F32A Depression, unspecified: Secondary | ICD-10-CM

## 2017-12-05 NOTE — Progress Notes (Signed)
   Subjective:    Patient ID: Joan Atkinson, female    DOB: [birth year], 37 y.o.   MRN: [id number]  HPI Depression- ongoing issue for pt.  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  Pt reports mood is still good despite all the recent changes.  No need to increase medication at this time.  Obesity- pt has gained 8 lbs since last visit.  Is doing a daily 1 mile walk w/ dog.  no particular diet.    Review of Systems For ROS see HPI     Objective:   Physical Exam  Constitutional: She is oriented to person, place, and time. She appears well-developed and well-nourished. No distress.  obese  HENT:  Head: Normocephalic and atraumatic.  Eyes: Pupils are equal, round, and reactive to light. Conjunctivae and EOM are normal.  Neck: Normal range of motion. Neck supple. No thyromegaly present.  Cardiovascular: Normal rate, regular rhythm, normal heart sounds and intact distal pulses.  No murmur heard. Pulmonary/Chest: Effort normal and breath sounds normal. No respiratory distress.  Abdominal: Soft. She exhibits no distension. There is no tenderness.  Musculoskeletal: She exhibits no edema.  Lymphadenopathy:    She has no cervical adenopathy.  Neurological: She is alert and oriented to person, place, and time.  Skin: Skin is warm and dry.  Psychiatric: She has a normal mood and affect. Her behavior is normal.  Vitals reviewed.         Assessment & Plan:

## 2017-12-05 NOTE — Patient Instructions (Signed)
Follow up as needed or as scheduled Keep up the good work on healthy diet and regular exercise- you can do it! Continue the Sertraline daily Call with any questions or concerns CONGRATS ON THE NEW JOB!!!

## 2017-12-05 NOTE — Assessment & Plan Note (Addendum)
Ongoing issue for pt.  She has gained 8 lbs since last visit.  Stressed need for healthy diet and regular exercise.  Pt expressed understanding and is in agreement w/ plan.

## 2017-12-05 NOTE — Assessment & Plan Note (Signed)
Currently well controlled.  Asymptomatic.  No med changes at this time.  Encouraged pt to establish new PCP when she moves to [place] so that there is not a lapse in care.  Pt expressed understanding and is in agreement w/ plan.

## 2017-12-25 ENCOUNTER — Other Ambulatory Visit: Payer: Self-pay | Admitting: Family Medicine

## 2017-12-25 ENCOUNTER — Encounter: Payer: Self-pay | Admitting: Family Medicine

## 2017-12-25 MED ORDER — DROSPIRENONE-ETHINYL ESTRADIOL 3-0.02 MG PO TABS: 1.0000 | ORAL_TABLET | Freq: Every day | ORAL | 0 refills | Status: DC

## 2018-02-08 ENCOUNTER — Other Ambulatory Visit: Payer: Self-pay | Admitting: Family Medicine

## 2018-02-16 ENCOUNTER — Encounter: Payer: Self-pay | Admitting: Family Medicine

## 2018-02-26 ENCOUNTER — Encounter: Payer: Self-pay | Admitting: Family Medicine

## 2018-02-26 MED ORDER — DROSPIRENONE-ETHINYL ESTRADIOL 3-0.02 MG PO TABS: 1.0000 | ORAL_TABLET | Freq: Every day | ORAL | 0 refills | Status: DC

## 2018-05-15 ENCOUNTER — Other Ambulatory Visit: Payer: Self-pay | Admitting: Family Medicine

## 2018-05-31 ENCOUNTER — Other Ambulatory Visit: Payer: Self-pay | Admitting: Family Medicine

## 2018-08-27 ENCOUNTER — Other Ambulatory Visit: Payer: Self-pay | Admitting: Family Medicine

## 2018-09-19 ENCOUNTER — Other Ambulatory Visit: Payer: Self-pay | Admitting: Family Medicine

## 2018-10-25 ENCOUNTER — Other Ambulatory Visit: Payer: Self-pay | Admitting: Family Medicine

## 2018-10-29 ENCOUNTER — Other Ambulatory Visit: Payer: Self-pay | Admitting: Family Medicine

## 2018-11-29 ENCOUNTER — Other Ambulatory Visit: Payer: Self-pay | Admitting: Family Medicine

## 2018-11-30 NOTE — Telephone Encounter (Signed)
Last OV 12/05/17 zoloft last filled 10/16/18 #30 with 0

## 2018-11-30 NOTE — Telephone Encounter (Signed)
Will refill but pt needs to schedule an appt for additional meds.  Can be virtual

## 2018-12-01 ENCOUNTER — Encounter: Payer: Self-pay | Admitting: *Deleted

## 2018-12-01 NOTE — Telephone Encounter (Signed)
Mychart message has been sent to patient to let her know medication was filled and asked her to schedule a virtual visit for further refills.

## 2018-12-28 ENCOUNTER — Other Ambulatory Visit: Payer: Self-pay | Admitting: Family Medicine

## 2021-01-24 ENCOUNTER — Encounter: Payer: Self-pay | Admitting: *Deleted
